# Patient Record
Sex: Male | Born: 1955
Health system: Southern US, Community
[De-identification: ages and names within clinical notes are randomized; demographics above are authoritative.]

## PROBLEM LIST (undated history)

## (undated) DIAGNOSIS — I639 Cerebral infarction, unspecified: Secondary | ICD-10-CM

## (undated) DIAGNOSIS — I219 Acute myocardial infarction, unspecified: Secondary | ICD-10-CM

## (undated) DIAGNOSIS — I10 Essential (primary) hypertension: Secondary | ICD-10-CM

## (undated) DIAGNOSIS — E119 Type 2 diabetes mellitus without complications: Secondary | ICD-10-CM

## (undated) DIAGNOSIS — F209 Schizophrenia, unspecified: Secondary | ICD-10-CM

---

## 2019-09-28 ENCOUNTER — Ambulatory Visit (HOSPITAL_COMMUNITY)
Admission: RE | Admit: 2019-09-28 | Discharge: 2019-09-28 | Disposition: A | Discharge status: Home / Self Care | Payer: TRICARE For Life (TFL) | Attending: Psychiatry | Admitting: Psychiatry

## 2019-09-28 NOTE — BH Assessment (Signed)
Assessment Note  William Fitzpatrick is an 63 y.o. male. Pt denies SI/HI. Pt is experiencing paranoia. Per Pt he was D/C from [facility] yesterday. The Pt was hospitalized for 10 days for paranoia. The Pt has been diagnosed with Schizophrenia. Per Pt he experiences paranoia daily and it has been affecting him most of his life. Per Pt his physician advised him to come to  [facility] after reporting paranoia to him. The Pt states he feels he will go to jail school because of past crimes. The Pt did not want to disclose the crimes. The Pt denied being paranoid that he would harm others or that others would harm him. Pt reports a support system.  Alcario Drought, NP recommends D/C and follow-up at the [place].  Diagnosis:  F20.9 Schizophrenia  Past Medical History: No past medical history on file.   Family History: No family history on file.  Social History:  has no history on file for tobacco, alcohol, and drug.  Additional Social History:  Alcohol / Drug Use Pain Medications: please see mar Prescriptions: please see mar Over the Counter: please see mar History of alcohol / drug use?: No history of alcohol / drug abuse  CIWA:   COWS:    Allergies: Not on File  Home Medications: (Not in a hospital admission)   OB/GYN Status:  No LMP for male patient.  General Assessment Data Location of Assessment: [facility] Assessment [facility] TTS Assessment: In system Is this a Tele or Face-to-Face Assessment?: Face-to-Face Is this an Initial Assessment or a Re-assessment for this encounter?: Initial Assessment Patient Accompanied by:: N/A Language Other than English: No Living Arrangements: Other (Comment) What gender do you identify as?: Male Marital status: Separated Maiden name: NA Pregnancy Status: No Living Arrangements: Alone Can pt return to current living arrangement?: No Admission Status: Voluntary Is patient capable of signing voluntary admission?: Yes Referral Source: Self/Family/Friend Insurance type:  T[employer]are  Medical Screening Exam [facility] Walk-in ONLY) Medical Exam completed: Yes  Crisis Care Plan Living Arrangements: Alone Legal Guardian: Other:(self) Name of Psychiatrist: NA Name of Therapist: NA  Education Status Is patient currently in school?: No Is the patient employed, unemployed or receiving disability?: Receiving disability income  Risk to self with the past 6 months Suicidal Ideation: No Has patient been a risk to self within the past 6 months prior to admission? : No Suicidal Intent: No Has patient had any suicidal intent within the past 6 months prior to admission? : No Is patient at risk for suicide?: No Suicidal Plan?: No Has patient had any suicidal plan within the past 6 months prior to admission? : No Access to Means: No What has been your use of drugs/alcohol within the last 12 months?: NA Previous Attempts/Gestures: No How many times?: 0 Other Self Harm Risks: NA Triggers for Past Attempts: None known Intentional Self Injurious Behavior: None Family Suicide History: No Recent stressful life event(s): Other (Comment) Persecutory voices/beliefs?: No Depression: No Depression Symptoms: (pt denies) Substance abuse history and/or treatment for substance abuse?: No Suicide prevention information given to non-admitted patients: Not applicable  Risk to Others within the past 6 months Homicidal Ideation: No Does patient have any lifetime risk of violence toward others beyond the six months prior to admission? : No Thoughts of Harm to Others: No Current Homicidal Intent: No Current Homicidal Plan: No Access to Homicidal Means: No Identified Victim: NA History of harm to others?: No Assessment of Violence: None Noted Violent Behavior Description: NA Does patient have access to  weapons?: No Criminal Charges Pending?: No Does patient have a court date: No Is patient on probation?: No  Psychosis Hallucinations: None noted Delusions: Unspecified  Mental  Status Report Appearance/Hygiene: Unremarkable Eye Contact: Fair Motor Activity: Freedom of movement Speech: Logical/coherent Level of Consciousness: Alert Mood: Euthymic Affect: Appropriate to circumstance Anxiety Level: Minimal Thought Processes: Coherent Judgement: Impaired Orientation: Person, Place, Time, Situation Obsessive Compulsive Thoughts/Behaviors: None  Cognitive Functioning Concentration: Normal Memory: Recent Intact, Remote Intact Is patient IDD: No Insight: Fair Impulse Control: Fair Appetite: Fair Have you had any weight changes? : No Change Sleep: No Change Total Hours of Sleep: 8 Vegetative Symptoms: None  ADLScreening [facility] Assessment Services) Patient's cognitive ability adequate to safely complete daily activities?: Yes Patient able to express need for assistance with ADLs?: Yes Independently performs ADLs?: Yes (appropriate for developmental age)  Prior Inpatient Therapy Prior Inpatient Therapy: Yes Prior Therapy Dates: 10/13 [facility] Reason for Treatment: Schizophrenia  Prior Outpatient Therapy Prior Outpatient Therapy: Yes Prior Therapy Dates: current [facility] Reason for Treatment: Schizophreniak Does patient have an ACCT team?: No Does patient have Intensive In-House Services?  : No Does patient have [facility] services? : No Does patient have P4CC services?: No  ADL Screening (condition at time of admission) Patient's cognitive ability adequate to safely complete daily activities?: Yes Is the patient deaf or have difficulty hearing?: No Does the patient have difficulty seeing, even when wearing glasses/contacts?: No Does the patient have difficulty concentrating, remembering, or making decisions?: No Patient able to express need for assistance with ADLs?: Yes Does the patient have difficulty dressing or bathing?: No Independently performs ADLs?: Yes (appropriate for developmental  age)       Abuse/Neglect Assessment (Assessment to be complete while patient is alone) Abuse/Neglect Assessment Can Be Completed: Yes Physical Abuse: Denies Verbal Abuse: Denies Sexual Abuse: Denies Exploitation of patient/patient's resources: Denies     Regulatory affairs officer (For Healthcare) Does Patient Have a Medical Advance Directive?: No Would patient like information on creating a medical advance directive?: No - Patient declined          Disposition:  Disposition Initial Assessment Completed for this Encounter: Yes Disposition of Patient: Discharge  On Site Evaluation by:   Reviewed with Physician:    Cyndia Bent 09/28/2019 6:03 PM

## 2019-09-28 NOTE — H&P (Addendum)
Behavioral Health Medical Screening Exam  William Fitzpatrick is an 63 y.o. male.  Presents to behavioral health as a walk-in reporting chronic paranoid ideations.  He reports he was recently discharged from inpatient admission in [place] at the [facility] on yesterday.  He reports he was advised to follow-up here ([place])  in order to be transported to the [place] in [place].  Patient reports spending 10 days in inpatient with follow-up with a follow-up appointment scheduled via tele-health.  Patient reports his support system is located in [place] and was reported he continues to speak to his wife daily.  He denies suicidal or homicidal ideations.  Denies auditory or visual hallucinations.  Reported medication adjustments well and patient.  Reported diagnosis of schizophrenia, depression and paranoid ideations.  Patient encouraged to keep follow-up appointments as recommended by [facility].  Support, encouragement and reassurance was provided.  Total Time spent with patient: 15 minutes     Psychiatric Specialty Exam: Physical Exam  Vitals reviewed. Constitutional: He appears well-developed.  Psychiatric: He has a normal mood and affect. His behavior is normal.    Review of Systems  Psychiatric/Behavioral: Positive for depression. Negative for substance abuse and suicidal ideas. The patient is nervous/anxious.   All other systems reviewed and are negative.   There were no vitals taken for this visit.There is no height or weight on file to calculate BMI.  General Appearance: Casual  Eye Contact:  Fair  Speech:  Clear and Coherent  Volume:  Normal  Mood:  Anxious and Depressed  Affect:  Congruent  Thought Process:  Coherent  Orientation:  Full (Time, Place, and Person)  Thought Content:  Logical and Paranoid Ideation  Suicidal Thoughts:  No  Homicidal Thoughts:  No  Memory:  Immediate;   Fair Recent;   Fair  Judgement:  Fair  Insight:  Fair  Psychomotor Activity:  Normal   Concentration: Concentration: Fair  Recall:  [employer] of Knowledge:Fair  Language: Fair  Akathisia:  No  Handed:  Right  AIMS (if indicated):     Assets:  Communication Skills Desire for Improvement Resilience Social Support  Sleep:       Musculoskeletal: Strength & Muscle Tone: within normal limits Gait & Station: normal Patient leans: N/A  There were no vitals taken for this visit.  Recommendations: Keep follow-[facility] appointment [facility] Based on my evaluation the patient does not appear to have an emergency medical condition.  Derrill Center, NP 09/28/2019, 3:24 PM

## 2019-10-01 ENCOUNTER — Encounter (HOSPITAL_COMMUNITY): Payer: Self-pay | Admitting: Emergency Medicine

## 2019-10-01 ENCOUNTER — Emergency Department (HOSPITAL_COMMUNITY)
Admission: EM | Admit: 2019-10-01 | Discharge: 2019-10-02 | Disposition: A | Discharge status: Home / Self Care | Payer: Medicare Other | Attending: Emergency Medicine | Admitting: Emergency Medicine

## 2019-10-01 ENCOUNTER — Other Ambulatory Visit: Payer: Self-pay

## 2019-10-01 DIAGNOSIS — F32A Depression, unspecified: Secondary | ICD-10-CM

## 2019-10-01 DIAGNOSIS — R45851 Suicidal ideations: Secondary | ICD-10-CM

## 2019-10-01 DIAGNOSIS — F329 Major depressive disorder, single episode, unspecified: Secondary | ICD-10-CM

## 2019-10-01 DIAGNOSIS — F203 Undifferentiated schizophrenia: Secondary | ICD-10-CM | POA: Insufficient documentation

## 2019-10-01 DIAGNOSIS — Z915 Personal history of self-harm: Secondary | ICD-10-CM

## 2019-10-01 DIAGNOSIS — Z794 Long term (current) use of insulin: Secondary | ICD-10-CM | POA: Insufficient documentation

## 2019-10-01 DIAGNOSIS — Z79899 Other long term (current) drug therapy: Secondary | ICD-10-CM | POA: Diagnosis not present

## 2019-10-01 DIAGNOSIS — F209 Schizophrenia, unspecified: Secondary | ICD-10-CM

## 2019-10-01 DIAGNOSIS — Z7982 Long term (current) use of aspirin: Secondary | ICD-10-CM | POA: Diagnosis not present

## 2019-10-01 DIAGNOSIS — F17210 Nicotine dependence, cigarettes, uncomplicated: Secondary | ICD-10-CM | POA: Diagnosis not present

## 2019-10-01 DIAGNOSIS — F200 Paranoid schizophrenia: Secondary | ICD-10-CM

## 2019-10-01 HISTORY — DX: Schizophrenia, unspecified: F20.9

## 2019-10-01 LAB — COMPREHENSIVE METABOLIC PANEL
ALT: 28 U/L (ref 0–44)
AST: 25 U/L (ref 15–41)
Albumin: 4.3 g/dL (ref 3.5–5.0)
Alkaline Phosphatase: 102 U/L (ref 38–126)
Anion gap: 12 (ref 5–15)
BUN: 11 mg/dL (ref 8–23)
CO2: 24 mmol/L (ref 22–32)
Calcium: 9.5 mg/dL (ref 8.9–10.3)
Chloride: 95 mmol/L — ABNORMAL LOW (ref 98–111)
Creatinine, Ser: 0.93 mg/dL (ref 0.61–1.24)
GFR calc Af Amer: >60 mL/min (ref >60)
GFR calc non Af Amer: >60 mL/min (ref >60)
Glucose, Bld: 204 mg/dL — ABNORMAL HIGH (ref 70–99)
Potassium: 3.3 mmol/L — ABNORMAL LOW (ref 3.5–5.1)
Sodium: 131 mmol/L — ABNORMAL LOW (ref 135–145)
Total Bilirubin: 0.7 mg/dL (ref 0.3–1.2)
Total Protein: 7.5 g/dL (ref 6.5–8.1)

## 2019-10-01 LAB — CBG MONITORING, ED
Glucose-Capillary: 261 mg/dL — ABNORMAL HIGH (ref 70–99)
Glucose-Capillary: 131 mg/dL — ABNORMAL HIGH (ref 70–99)
Glucose-Capillary: 168 mg/dL — ABNORMAL HIGH (ref 70–99)

## 2019-10-01 LAB — ETHANOL: Alcohol, Ethyl (B): <10 mg/dL (ref <10)

## 2019-10-01 LAB — RAPID URINE DRUG SCREEN, HOSP PERFORMED
Amphetamines: NOT DETECTED
Barbiturates: NOT DETECTED
Benzodiazepines: NOT DETECTED
Cocaine: NOT DETECTED
Opiates: NOT DETECTED
Tetrahydrocannabinol: NOT DETECTED

## 2019-10-01 LAB — CBC
HCT: 43.9 % (ref 39.0–52.0)
Hemoglobin: 15.5 g/dL (ref 13.0–17.0)
MCH: 32 pg (ref 26.0–34.0)
MCHC: 35.3 g/dL (ref 30.0–36.0)
MCV: 90.7 fL (ref 80.0–100.0)
Platelets: 287 10*3/uL (ref 150–400)
RBC: 4.84 MIL/uL (ref 4.22–5.81)
RDW: 11.4 % — ABNORMAL LOW (ref 11.5–15.5)
WBC: 11.2 10*3/uL — ABNORMAL HIGH (ref 4.0–10.5)
nRBC: 0 % (ref 0.0–0.2)

## 2019-10-01 LAB — ACETAMINOPHEN LEVEL: Acetaminophen (Tylenol), Serum: <10 ug/mL — ABNORMAL LOW (ref 10–30)

## 2019-10-01 LAB — SALICYLATE LEVEL: Salicylate Lvl: <7 mg/dL (ref 2.8–30.0)

## 2019-10-01 MED ORDER — POTASSIUM CHLORIDE CRYS ER 20 MEQ PO TBCR
40.0000 meq | EXTENDED_RELEASE_TABLET | Freq: Once | ORAL | Status: AC
  Administered 2019-10-01: 40 meq via ORAL
  Filled 2019-10-01: qty 2

## 2019-10-01 MED ORDER — INSULIN ASPART 100 UNIT/ML ~~LOC~~ SOLN
5.0000 [IU] | Freq: Three times a day (TID) | SUBCUTANEOUS | Status: DC
  Administered 2019-10-01 – 2019-10-02 (×5): 5 [IU] via SUBCUTANEOUS
  Filled 2019-10-01: qty 0.05

## 2019-10-01 MED ORDER — AMLODIPINE BESYLATE 5 MG PO TABS
10.0000 mg | ORAL_TABLET | Freq: Every day | ORAL | Status: DC
  Administered 2019-10-01 – 2019-10-02 (×2): 10 mg via ORAL
  Filled 2019-10-01 (×2): qty 2

## 2019-10-01 MED ORDER — APOAEQUORIN 20 MG PO CAPS: 20.0000 mg | ORAL_CAPSULE | Freq: Every day | ORAL | Status: DC

## 2019-10-01 MED ORDER — TRAZODONE HCL 50 MG PO TABS
75.0000 mg | ORAL_TABLET | Freq: Every day | ORAL | Status: DC
  Administered 2019-10-01: 75 mg via ORAL
  Filled 2019-10-01: qty 2

## 2019-10-01 MED ORDER — LEVOTHYROXINE SODIUM 75 MCG PO TABS
75.0000 ug | ORAL_TABLET | Freq: Every day | ORAL | Status: DC
  Administered 2019-10-01 – 2019-10-02 (×2): 75 ug via ORAL
  Filled 2019-10-01 (×2): qty 1

## 2019-10-01 MED ORDER — ASPIRIN EC 81 MG PO TBEC
81.0000 mg | DELAYED_RELEASE_TABLET | Freq: Every day | ORAL | Status: DC
  Administered 2019-10-01 – 2019-10-02 (×2): 81 mg via ORAL
  Filled 2019-10-01 (×2): qty 1

## 2019-10-01 MED ORDER — TRAZODONE HCL 50 MG PO TABS: 75.0000 mg | ORAL_TABLET | Freq: Every day | ORAL | Status: DC

## 2019-10-01 MED ORDER — INSULIN GLARGINE 100 UNIT/ML ~~LOC~~ SOLN
20.0000 [IU] | Freq: Every day | SUBCUTANEOUS | Status: DC
  Administered 2019-10-01 – 2019-10-02 (×2): 20 [IU] via SUBCUTANEOUS
  Filled 2019-10-01 (×2): qty 0.2

## 2019-10-01 MED ORDER — INSULIN GLARGINE 100 UNIT/ML ~~LOC~~ SOLN
20.0000 [IU] | Freq: Every day | SUBCUTANEOUS | Status: DC
  Administered 2019-10-01: 20 [IU] via SUBCUTANEOUS
  Filled 2019-10-01: qty 0.2

## 2019-10-01 MED ORDER — HYDROCHLOROTHIAZIDE 12.5 MG PO CAPS
12.5000 mg | ORAL_CAPSULE | Freq: Every day | ORAL | Status: DC
  Administered 2019-10-01 – 2019-10-02 (×2): 12.5 mg via ORAL
  Filled 2019-10-01 (×3): qty 1

## 2019-10-01 MED ORDER — OLANZAPINE 10 MG PO TABS
10.0000 mg | ORAL_TABLET | Freq: Every day | ORAL | Status: DC
  Administered 2019-10-01: 10 mg via ORAL
  Filled 2019-10-01: qty 1

## 2019-10-01 MED ORDER — LOSARTAN POTASSIUM 50 MG PO TABS
100.0000 mg | ORAL_TABLET | Freq: Every day | ORAL | Status: DC
  Administered 2019-10-01 – 2019-10-02 (×2): 100 mg via ORAL
  Filled 2019-10-01 (×3): qty 2

## 2019-10-01 NOTE — ED Triage Notes (Signed)
Patient is stating he is having thoughts of suicide. Patient states that it started about two hour after he called the hotline. Patient is a English as a second language teacher.

## 2019-10-01 NOTE — ED Provider Notes (Signed)
[place] DEPT Provider Note   CSN: [id number] Arrival date & time: 10/01/19  0111     History   Chief Complaint Chief Complaint  Patient presents with   Suicidal    HPI William Fitzpatrick is a 63 y.o. male.     Patient with history of depression presents with suicidal ideation that started earlier this evening. He denies any self harm prior to arrival. No HI/AVH. He reports previous attempt remotely (1990's). He denies any physical complaints.   The history is provided by the patient. No language interpreter was used.    No past medical history on file.  There are no active problems to display for this patient.    Home Medications    Prior to Admission medications   Medication Sig Start Date End Date Taking? Authorizing Provider  amLODipine (NORVASC) 10 MG tablet Take 10 mg by mouth daily.   Yes [provider]  Apoaequorin (PREVAGEN EXTRA STRENGTH) 20 MG CAPS Take 20 mg by mouth daily.   Yes [provider]  aspirin EC 81 MG tablet Take 81 mg by mouth daily.   Yes [provider]  hydrochlorothiazide (MICROZIDE) 12.5 MG capsule Take 12.5 mg by mouth daily.   Yes [provider]  insulin aspart (NOVOLOG) 100 UNIT/ML injection Inject 5 Units into the skin 3 (three) times daily before meals.   Yes [provider]  insulin glargine (LANTUS) 100 UNIT/ML injection Inject 20 Units into the skin daily.   Yes [provider]  levothyroxine (SYNTHROID) 75 MCG tablet Take 75 mcg by mouth daily before breakfast.   Yes [provider]  losartan (COZAAR) 100 MG tablet Take 100 mg by mouth daily.   Yes [provider]  OLANZapine (ZYPREXA) 20 MG tablet Take 10 mg by mouth at bedtime.   Yes [provider]  traZODone (DESYREL) 150 MG tablet Take 75 mg by mouth at bedtime.   Yes [provider]  Vitamin D, Ergocalciferol, (DRISDOL) 1.25 MG (50000 UT) CAPS capsule Take  50,000 Units by mouth every Tuesday.   Yes [provider]    Family History No family history on file.  Social History Social History   Tobacco Use   Smoking status: Not on file  Substance Use Topics   Alcohol use: Not Currently   Drug use: Not Currently     Allergies   Patient has no known allergies.   Review of Systems Review of Systems  Constitutional: Negative for chills and fever.  HENT: Negative.   Respiratory: Negative.   Cardiovascular: Negative.   Gastrointestinal: Negative.   Musculoskeletal: Negative.   Skin: Negative.   Neurological: Negative.   Psychiatric/Behavioral: Positive for dysphoric mood and suicidal ideas.     Physical Exam Updated Vital Signs BP (!) 160/82 (BP Location: Right Arm)    Pulse 74    Temp 98.1 F (36.7 C) (Oral)    Resp 18    Ht 5\' 8"  (1.727 m)    Wt 86.2 kg    SpO2 99%    BMI 28.89 kg/m   Physical Exam Constitutional:      Appearance: He is well-developed.  HENT:     Head: Normocephalic.  Neck:     Musculoskeletal: Normal range of motion and neck supple.  Cardiovascular:     Rate and Rhythm: Normal rate and regular rhythm.  Pulmonary:     Effort: Pulmonary effort is normal.     Breath sounds: Normal breath sounds.  Abdominal:     General: Bowel sounds are normal.     Palpations: Abdomen is soft.     Tenderness: There is no abdominal tenderness. There is no guarding or rebound.  Musculoskeletal: Normal range of motion.  Skin:    General: Skin is warm and dry.     Findings: No rash.  Neurological:     Mental Status: He is alert and oriented to person, place, and time.  Psychiatric:        Attention and Perception: He does not perceive auditory or visual hallucinations.        Mood and Affect: Mood is depressed.        Speech: Speech normal.        Behavior: Behavior normal. Behavior is cooperative.        Thought Content: Thought content includes suicidal ideation. Thought content does not include  homicidal ideation.      ED Treatments / Results  Labs (all labs ordered are listed, but only abnormal results are displayed) Labs Reviewed  COMPREHENSIVE METABOLIC PANEL  ETHANOL  SALICYLATE LEVEL  ACETAMINOPHEN LEVEL  CBC  RAPID URINE DRUG SCREEN, HOSP PERFORMED    EKG None  Radiology No results found.  Procedures Procedures (including critical care time)  Medications Ordered in ED Medications - No data to display   Initial Impression / Assessment and Plan / ED Course  I have reviewed the triage vital signs and the nursing notes.  Pertinent labs & imaging results that were available during my care of the patient were reviewed by me and considered in my medical decision making (see chart for details).        Patient to ED with SI, remote h/o attempt. He is oriented, cooperative. Depressed affect.   Labs reviewed. He is considered medically cleared. TTS evaluation completed. He will be kept in the ED tonight and re-evaluated by psychiatry in the am.   During ED encounter, the patient endorses that his SI has gone away. He states that it was related to feeling that he had killed someone and has found out that is not the case. He further states he has been under investigation for the past 40 years and is concerned that he is facing life in prison or the death penalty. New statements were discussed with TTS, T[place]lese, who reports she is aware and recommendation was still to have psych assess in am.   Feel the patient exhibits depressive symptoms, has admitted SI and has a history of same. Further, the patient is making statements that may represent acute delusion or psychosis. If he attempts to discharge home, feel IVC would be reasonable.    Final Clinical Impressions(s) / ED Diagnoses   Final diagnoses:  None   1. Depression 2. Suicidal ideation  ED Discharge Orders    None       Elpidio Anis, Cordelia Poche 10/01/19 0720    Mesner, Barbara Cower, MD 10/01/19 2257

## 2019-10-01 NOTE — BHH Counselor (Signed)
Clinician to spoke [place], [place] and the pt denies he's and wants something to clam him down. Clinician noted Shari's concerns based on the pt's active delusions. Clinician expressed during the assessment the pt shared he feels he killed a couple of people when he was working on a house because he moved the pool pump a few feet, no crime has been filed or reported, he just feels like he did it.     Vertell Novak, MS, [facility], [facility] Triage Specialist [phone].

## 2019-10-01 NOTE — Progress Notes (Signed)
Received Nikalas from the main ED at 0430 hrs this AM. A sitter is at the bedside. He immediately went to sleep and was awaken for his VS check and he received his synthroid. He continued to sleep throughout the morning.

## 2019-10-01 NOTE — ED Notes (Signed)
Error insulin lantus not given 20 units not given

## 2019-10-01 NOTE — ED Notes (Addendum)
Pt changed into paper scrubs, wanded by security, and belongings (one white bag) placed in the cabinet labeled, "patient belongings [address]."
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
## 2019-10-01 NOTE — [facility] Counselor (Signed)
TTS consult to complete assessment however there is no documentation indicting the reason for TTS consult. Clinician to check back.    Vertell Novak, [place], [facility], [facility] Triage Specialist [phone]

## 2019-10-01 NOTE — ED Notes (Signed)
Pt to room 42. Pt oriented to unit. Pt flat, dull, blunted. Pt upset with thoughts. Pt stated, "wants to get help with thoughts."  Pt denied SI but having thoughts.  Pt having about the bad things he has done.

## 2019-10-01 NOTE — Progress Notes (Signed)
PHARMACIST - PHYSICIAN ORDER COMMUNICATION  CONCERNING: P&T Medication Policy on Herbal Medications  DESCRIPTION:  This patient's order for:  Prevagen  has been noted.  This product(s) is classified as an "herbal" or natural product. Due to a lack of definitive safety studies or FDA approval, nonstandard manufacturing practices, plus the potential risk of unknown drug-drug interactions while on inpatient medications, the Pharmacy and Therapeutics Committee does not permit the use of "herbal" or natural products of this type within [facility].   ACTION TAKEN: The pharmacy department is unable to verify this order at this time and your patient has been informed of this safety policy. Please reevaluate patient's clinical condition at discharge and address if the herbal or natural product(s) should be resumed at that time.   Thanks Dorrene German 10/01/2019 2:52 AM
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
## 2019-10-02 DIAGNOSIS — F203 Undifferentiated schizophrenia: Secondary | ICD-10-CM | POA: Diagnosis not present

## 2019-10-02 LAB — CBG MONITORING, ED
Glucose-Capillary: 142 mg/dL — ABNORMAL HIGH (ref 70–99)
Glucose-Capillary: 190 mg/dL — ABNORMAL HIGH (ref 70–99)

## 2019-10-02 MED ORDER — POLYETHYLENE GLYCOL 3350 17 G PO PACK: 17.0000 g | PACK | Freq: Every day | ORAL | 0 refills | Status: DC

## 2019-10-02 MED ORDER — POLYETHYLENE GLYCOL 3350 17 G PO PACK
17.0000 g | PACK | Freq: Every day | ORAL | Status: DC
  Administered 2019-10-02: 17 g via ORAL
  Filled 2019-10-02: qty 1

## 2019-10-02 MED ORDER — INSULIN GLARGINE 100 UNIT/ML ~~LOC~~ SOLN: 20.0000 [IU] | Freq: Every day | SUBCUTANEOUS | 11 refills | Status: DC

## 2019-10-02 NOTE — Discharge Instructions (Signed)
Suicidal Feelings: How to Help Yourself Suicide is when you end your own life. There are many things you can do to help yourself feel better when struggling with these feelings. Many services and people are available to support you and others who struggle with similar feelings.  If you ever feel like you may hurt yourself or others, or have thoughts about taking your own life, get help right away. To get help:  Call your local emergency services (911 in the [place]  The [employer]'s health and human services helpline (211 in the [place]  Go to your nearest emergency department.  Call a suicide hotline to speak with a trained counselor. The following suicide hotlines are available in the [place]: ? 1-800-273-TALK (1-800-273-8255). ? 1-800-SUICIDE (1-800-784-2433). ? 1-888-628-9454. This is a hotline for Spanish speakers. ? 1-800-799-4889. This is a hotline for TTY users. ? 1-866-4-U-TREVOR (1-866-488-7386). This is a hotline for lesbian, gay, bisexual, transgender, or questioning youth. ? For a list of hotlines in [place], visit www.suicide.org/hotlines/international/[place]-suicide-hotlines.html  Contact a crisis center or a local suicide prevention center. To find a crisis center or suicide prevention center: ? Call your local hospital, clinic, community service organization, mental health center, social service provider, or health department. Ask for help with connecting to a crisis center. ? For a list of crisis centers in the [place], visit: suicidepreventionlifeline.org ? For a list of crisis centers in [place], visit: suicideprevention.ca How to help yourself feel better   Promise yourself that you will not do anything extreme when you have suicidal feelings. Remember, there is hope. Many people have gotten through suicidal thoughts and feelings, and you can too. If you have had these feelings before, remind yourself that you can get through them again.  Let family, friends,  teachers, or counselors know how you are feeling. Try not to separate yourself from those who care about you and want to help you. Talk with someone every day, even if you do not feel sociable. Face-to-face conversation is best to help them understand your feelings.  Contact a mental health care provider and work with this person regularly.  Make a safety plan that you can follow during a crisis. Include phone numbers of suicide prevention hotlines, mental health professionals, and trusted friends and family members you can call during an emergency. Save these numbers on your phone.  If you are thinking of taking a lot of medicine, give your medicine to someone who can give it to you as prescribed. If you are on antidepressants and are concerned you will overdose, tell your health care provider so that he or she can give you safer medicines.  Try to stick to your routines. Follow a schedule every day. Make self-care a priority.  Make a list of realistic goals, and cross them off when you achieve them. Accomplishments can give you a sense of worth.  Wait until you are feeling better before doing things that you find difficult or unpleasant.  Do things that you have always enjoyed to take your mind off your feelings. Try reading a book, or listening to or playing music. Spending time outside, in nature, may help you feel better. Follow these instructions at home:   Visit your primary health care provider every year for a checkup.  Work with a mental health care provider as needed.  Eat a well-balanced diet, and eat regular meals.  Get plenty of rest.  Exercise if you are able. Just 30 minutes of exercise each day can   help you feel better.  Take over-the-counter and prescription medicines only as told by your health care provider. Ask your mental health care provider about the possible side effects of any medicines you are taking.  Do not use alcohol or drugs, and remove these substances  from your home.  Remove weapons, poisons, knives, and other deadly items from your home. General recommendations  Keep your living space well lit.  When you are feeling well, write yourself a letter with tips and support that you can read when you are not feeling well.  Remember that life's difficulties can be sorted out with help. Conditions can be treated, and you can learn behaviors and ways of thinking that will help you. Where to find more information  National Suicide Prevention Lifeline: www.suicidepreventionlifeline.org  Hopeline: www.hopeline.com  American Foundation for Suicide Prevention: www.afsp.org  The Trevor Project (for lesbian, gay, bisexual, transgender, or questioning youth): www.thetrevorproject.org Contact a health care provider if:  You feel as though you are a burden to others.  You feel agitated, angry, vengeful, or have extreme mood swings.  You have withdrawn from family and friends. Get help right away if:  You are talking about suicide or wishing to die.  You start making plans for how to commit suicide.  You feel that you have no reason to live.  You start making plans for putting your affairs in order, saying goodbye, or giving your possessions away.  You feel guilt, shame, or unbearable pain, and it seems like there is no way out.  You are frequently using drugs or alcohol.  You are engaging in risky behaviors that could lead to death. If you have any of these symptoms, get help right away. Call emergency services, go to your nearest emergency department or crisis center, or call a suicide crisis helpline. Summary  Suicide is when you take your own life.  Promise yourself that you will not do anything extreme when you have suicidal feelings.  Let family, friends, teachers, or counselors know how you are feeling.  Get help right away if you feel as though life is getting too tough to handle and you are thinking about suicide. This  information is not intended to replace advice given to you by your health care provider. Make sure you discuss any questions you have with your health care provider. Document Released: 06/07/2003 Document Revised: 03/24/2019 Document Reviewed: 07/14/2017 Elsevier Patient Education  2020 Elsevier Inc.  

## 2019-10-02 NOTE — Discharge Summary (Addendum)
Patient re-evaluated today by the mental health team.  He greets the mental health team, is calm and cooperates with answering questions.  He appearance today is more casual and he is reasonably groomed.  He is more coherent today and states, " I am great and feeling really good mentally."  His mood is euthymic and his affect correlates this.  He no longer has suicidal ideations.  He denies homicidal ideations and denies audible or visual hallucinations.  He will follow-up with the [facility] for continued outpatient mental health services.    Disposition:  Discharge home.  The patient appears reasonably screened and/or stabilized for discharge and does not appear to have emergency medical/psychiatric concerns/conditions requiring further screening, evaluation, or treatment at this time prior to discharge.    Recommend the following:  1. Take all of you medications as prescribed by your mental healthcare provider.   2. Report any adverse effects and reactions from your medications to your outpatient provider promptly.  3.  Do not engage in alcohol and or illegal drug use while on prescription medicines. 4. Keep all scheduled appointments. This is to ensure that you are getting refills on time and to avoid any interruption in your medication.  If you are unable to keep an appointment call to reschedule.  Be sure to follow up with resources and follow ups given. 5. In the event of worsening symptoms call the crisis hotline, 911, and or go to the nearest emergency department for appropriate evaluation and treatment of symptoms. 6. Follow-up with your primary care provider for your medical issues, concerns and or health care needs.    Patient seen face-to-face for psychiatric evaluation, chart reviewed and case discussed with the physician extender and developed treatment plan. Reviewed the information documented and agree with the treatment plan. Corena Pilgrim, MD

## 2019-10-02 NOTE — ED Notes (Signed)
Pt discharged home. Discharged instructions read to pt who verbalized understanding. All belongings returned to pt. Denies SI/HI, is not delusional and not responding to internal stimuli. Escorted pt to the ED exit.   

## 2020-09-10 ENCOUNTER — Encounter: Payer: Self-pay | Admitting: Podiatry

## 2020-09-10 ENCOUNTER — Other Ambulatory Visit: Payer: Self-pay

## 2020-09-10 ENCOUNTER — Ambulatory Visit (INDEPENDENT_AMBULATORY_CARE_PROVIDER_SITE_OTHER): Payer: Medicare Other | Admitting: Podiatry

## 2020-09-10 VITALS — BP 167/94 | HR 78 | Temp 97.6°F

## 2020-09-10 DIAGNOSIS — B351 Tinea unguium: Secondary | ICD-10-CM | POA: Diagnosis not present

## 2020-09-10 DIAGNOSIS — M79674 Pain in right toe(s): Secondary | ICD-10-CM | POA: Diagnosis not present

## 2020-09-10 DIAGNOSIS — E1149 Type 2 diabetes mellitus with other diabetic neurological complication: Secondary | ICD-10-CM

## 2020-09-10 DIAGNOSIS — M79675 Pain in left toe(s): Secondary | ICD-10-CM | POA: Diagnosis not present

## 2020-09-10 DIAGNOSIS — E1140 Type 2 diabetes mellitus with diabetic neuropathy, unspecified: Secondary | ICD-10-CM | POA: Diagnosis not present

## 2020-09-10 NOTE — Patient Instructions (Signed)
Diabetes Mellitus and Foot Care Foot care is an important part of your health, especially when you have diabetes. Diabetes may cause you to have problems because of poor blood flow (circulation) to your feet and legs, which can cause your skin to:  Become thinner and drier.  Break more easily.  Heal more slowly.  Peel and crack. You may also have nerve damage (neuropathy) in your legs and feet, causing decreased feeling in them. This means that you may not notice minor injuries to your feet that could lead to more serious problems. Noticing and addressing any potential problems early is the best way to prevent future foot problems. How to care for your feet Foot hygiene  Wash your feet daily with warm water and mild soap. Do not use hot water. Then, pat your feet and the areas between your toes until they are completely dry. Do not soak your feet as this can dry your skin.  Trim your toenails straight across. Do not dig under them or around the cuticle. File the edges of your nails with an emery board or nail file.  Apply a moisturizing lotion or petroleum jelly to the skin on your feet and to dry, brittle toenails. Use lotion that does not contain alcohol and is unscented. Do not apply lotion between your toes. Shoes and socks  Wear clean socks or stockings every day. Make sure they are not too tight. Do not wear knee-high stockings since they may decrease blood flow to your legs.  Wear shoes that fit properly and have enough cushioning. Always look in your shoes before you put them on to be sure there are no objects inside.  To break in new shoes, wear them for just a few hours a day. This prevents injuries on your feet. Wounds, scrapes, corns, and calluses  Check your feet daily for blisters, cuts, bruises, sores, and redness. If you cannot see the bottom of your feet, use a mirror or ask someone for help.  Do not cut corns or calluses or try to remove them with medicine.  If you  find a minor scrape, cut, or break in the skin on your feet, keep it and the skin around it clean and dry. You may clean these areas with mild soap and water. Do not clean the area with peroxide, alcohol, or iodine.  If you have a wound, scrape, corn, or callus on your foot, look at it several times a day to make sure it is healing and not infected. Check for: ? Redness, swelling, or pain. ? Fluid or blood. ? Warmth. ? Pus or a bad smell. General instructions  Do not cross your legs. This may decrease blood flow to your feet.  Do not use heating pads or hot water bottles on your feet. They may burn your skin. If you have lost feeling in your feet or legs, you may not know this is happening until it is too late.  Protect your feet from hot and cold by wearing shoes, such as at the beach or on hot pavement.  Schedule a complete foot exam at least once a year (annually) or more often if you have foot problems. If you have foot problems, report any cuts, sores, or bruises to your health care provider immediately. Contact a health care provider if:  You have a medical condition that increases your risk of infection and you have any cuts, sores, or bruises on your feet.  You have an injury that is not   healing.  You have redness on your legs or feet.  You feel burning or tingling in your legs or feet.  You have pain or cramps in your legs and feet.  Your legs or feet are numb.  Your feet always feel cold.  You have pain around a toenail. Get help right away if:  You have a wound, scrape, corn, or callus on your foot and: ? You have pain, swelling, or redness that gets worse. ? You have fluid or blood coming from the wound, scrape, corn, or callus. ? Your wound, scrape, corn, or callus feels warm to the touch. ? You have pus or a bad smell coming from the wound, scrape, corn, or callus. ? You have a fever. ? You have a red line going up your leg. Summary  Check your feet every day  for cuts, sores, red spots, swelling, and blisters.  Moisturize feet and legs daily.  Wear shoes that fit properly and have enough cushioning.  If you have foot problems, report any cuts, sores, or bruises to your health care provider immediately.  Schedule a complete foot exam at least once a year (annually) or more often if you have foot problems. This information is not intended to replace advice given to you by your health care provider. Make sure you discuss any questions you have with your health care provider. Document Revised: 08/24/2019 Document Reviewed: 01/02/2017 Elsevier Patient Education  2020 Elsevier Inc.  

## 2020-09-10 NOTE — Progress Notes (Signed)
Subjective:   Patient ID: William Fitzpatrick, male   DOB: 64 y.o.   MRN: [id number]   HPI Patient presents with a long-term history of nail disease who is not in good control with his diabetes with A1c of 14 who has had nail care provided over the years and cannot take care of them himself.  Patient does smoke approximately half pack per day and is not currently active   Review of Systems  All other systems reviewed and are negative.       Objective:  Physical Exam Vitals and nursing note reviewed.  Constitutional:      Appearance: He is well-developed.  Pulmonary:     Effort: Pulmonary effort is normal.  Musculoskeletal:        General: Normal range of motion.  Skin:    General: Skin is warm.  Neurological:     Mental Status: He is alert.     Vascular status was found to be intact with patient found to have moderate diminishment sharp dull vibratory and is noted to have incurvated nailbeds 1-5 both feet that are thickened deformed and can become painful with very high A1c's and high risk for him to take care of this himself.  Patient did have good digital perfusion well oriented     Assessment:  Mycotic nail infection bilateral that do become painful with patient who has diabetic neuropathy and has high A1c     Plan:  H&P diabetic education given to patient and discussed.  At this point I debrided nailbeds 1-5 both feet no iatrogenic bleeding and patient will return for routine care and will be seen back if any other issues were occurred and at this time again I reviewed with him the importance of daily inspections of his feet

## 2020-10-26 ENCOUNTER — Other Ambulatory Visit: Payer: Self-pay

## 2020-10-26 ENCOUNTER — Encounter: Payer: Self-pay | Admitting: Endocrinology

## 2020-10-26 ENCOUNTER — Ambulatory Visit (INDEPENDENT_AMBULATORY_CARE_PROVIDER_SITE_OTHER): Payer: Medicare Other | Admitting: Endocrinology

## 2020-10-26 VITALS — BP 132/80 | HR 76 | Ht 68.0 in | Wt 188.0 lb

## 2020-10-26 DIAGNOSIS — Z794 Long term (current) use of insulin: Secondary | ICD-10-CM | POA: Diagnosis not present

## 2020-10-26 DIAGNOSIS — E1369 Other specified diabetes mellitus with other specified complication: Secondary | ICD-10-CM

## 2020-10-26 DIAGNOSIS — E11319 Type 2 diabetes mellitus with unspecified diabetic retinopathy without macular edema: Secondary | ICD-10-CM | POA: Diagnosis not present

## 2020-10-26 LAB — POCT GLYCOSYLATED HEMOGLOBIN (HGB A1C): Hemoglobin A1C: 10.2 % — AB (ref 4.0–5.6)

## 2020-10-26 MED ORDER — NOVOLOG MIX 70/30 FLEXPEN (70-30) 100 UNIT/ML ~~LOC~~ SUPN: PEN_INJECTOR | SUBCUTANEOUS | 3 refills | Status: DC

## 2020-10-26 NOTE — Patient Instructions (Signed)
good diet and exercise significantly improve the control of your diabetes.  please let me know if you wish to be referred to a dietician.  high blood sugar is very risky to your health.  you should see an eye doctor and dentist every year.  It is very important to get all recommended vaccinations.  Controlling your blood pressure and cholesterol drastically reduces the damage diabetes does to your body.  Those who smoke should quit.  Please discuss these with your doctor.  check your blood sugar 4 times a day: before the 3 meals, and at bedtime.  also check if you have symptoms of your blood sugar being too high or too low.  please keep a record of the readings and bring it to your next appointment here (or you can bring the meter itself).  You can write it on any piece of paper.  please call us sooner if your blood sugar goes below 70, or if you have a lot of readings over 200. For now, please change the insulin to 35 units units with breakfast, and 15 with supper. Please see a pump trainer.  Please come back for a follow-up appointment in 1 month.

## 2020-10-26 NOTE — Progress Notes (Signed)
Subjective:    Patient ID: William Fitzpatrick, male    DOB: March 16, 1956, 64 y.o.   MRN: [id number]  HPI pt is referred by Hillery Aldo, NP, for diabetes.  Pt states DM was dx'ed in 1994; it is complicated by DR; he has been on insulin since 2010 (he took pump rx 2020, until [place] advised him to d/c--pt says he had good glycemic control on it); pt says his diet and exercise are not good; he has never had pancreatitis, pancreatic surgery, severe hypoglycemia or DKA.  He takes Novolog 70/30, 25 units BID.  He brings a record of his cbg's which I have reviewed today.  cbg varies from 72-334.  It is in general higher as the day goes on.   Past Medical History:  Diagnosis Date  . Schizophrenia (HCC)     No past surgical history on file.  Social History   Socioeconomic History  . Marital status: Legally Separated    Spouse name: Not on file  . Number of children: Not on file  . Years of education: Not on file  . Highest education level: Not on file  Occupational History  . Not on file  Tobacco Use  . Smoking status: Current Every Day Smoker    Types: Cigarettes  . Smokeless tobacco: Never Used  Vaping Use  . Vaping Use: Never used  Substance and Sexual Activity  . Alcohol use: Yes  . Drug use: Not Currently  . Sexual activity: Not on file  Other Topics Concern  . Not on file  Social History Narrative  . Not on file   Social Determinants of Health   Financial Resource Strain:   . Difficulty of Paying Living Expenses: Not on file  Food Insecurity:   . Worried About Programme researcher, broadcasting/film/video in the Last Year: Not on file  . Ran Out of Food in the Last Year: Not on file  Transportation Needs:   . Lack of Transportation (Medical): Not on file  . Lack of Transportation (Non-Medical): Not on file  Physical Activity:   . Days of Exercise per Week: Not on file  . Minutes of Exercise per Session: Not on file  Stress:   . Feeling of Stress : Not on file  Social Connections:   . Frequency of  Communication with Friends and Family: Not on file  . Frequency of Social Gatherings with Friends and Family: Not on file  . Attends Religious Services: Not on file  . Active Member of Clubs or Organizations: Not on file  . Attends Banker Meetings: Not on file  . Marital Status: Not on file  Intimate Partner Violence:   . Fear of Current or Ex-Partner: Not on file  . Emotionally Abused: Not on file  . Physically Abused: Not on file  . Sexually Abused: Not on file    Current Outpatient Medications on File Prior to Visit  Medication Sig Dispense Refill  . amLODipine (NORVASC) 10 MG tablet Take 10 mg by mouth daily.    . hydrochlorothiazide (MICROZIDE) 12.5 MG capsule Take 12.5 mg by mouth daily.    Marland Kitchen levothyroxine (SYNTHROID) 75 MCG tablet Take 75 mcg by mouth daily before breakfast.    . metFORMIN (GLUCOPHAGE-XR) 500 MG 24 hr tablet Take 1,000 mg by mouth 2 (two) times daily.    Marland Kitchen aspirin EC 81 MG tablet Take 81 mg by mouth daily. (Patient not taking: Reported on 10/26/2020)    . losartan (COZAAR) 100 MG tablet  Take 100 mg by mouth daily. (Patient not taking: Reported on 10/26/2020)    . OLANZapine (ZYPREXA) 20 MG tablet Take 10 mg by mouth at bedtime. (Patient not taking: Reported on 10/26/2020)    . polyethylene glycol (MIRALAX / GLYCOLAX) 17 g packet Take 17 g by mouth daily. (Patient not taking: Reported on 10/26/2020) 14 each 0  . traZODone (DESYREL) 150 MG tablet Take 75 mg by mouth at bedtime. (Patient not taking: Reported on 10/26/2020)    . Vitamin D, Ergocalciferol, (DRISDOL) 1.25 MG (50000 UT) CAPS capsule Take 50,000 Units by mouth every Tuesday. (Patient not taking: Reported on 10/26/2020)     No current facility-administered medications on file prior to visit.    No Known Allergies  Family History  Problem Relation Age of Onset  . Diabetes Neg Hx     BP 132/80   Pulse 76   Ht 5\' 8"  (1.727 m)   Wt 188 lb (85.3 kg)   SpO2 97%   BMI 28.59 kg/m     Review of Systems denies n/v, urinary frequency, and depression.  He has lost 20 lbs x 1 year--unintentional.       Objective:   Physical Exam VITAL SIGNS:  See vs page GENERAL: no distress Pulses: dorsalis pedis intact bilat.   MSK: no deformity of the feet CV: no leg edema Skin:  no ulcer on the feet.  normal color and temp on the feet. Neuro: sensation is intact to touch on the feet.   Lab Results  Component Value Date   HGBA1C 10.2 (A) 10/26/2020    I have reviewed outside records, and summarized: Pt was noted to have elevated A1c, and referred here.  HTN, smoking, obesity, and dyslipidemia were also addressed.      Assessment & Plan:  Insulin-requiring type 2 DM, with DR: uncontrolled.    Patient Instructions  good diet and exercise significantly improve the control of your diabetes.  please let me know if you wish to be referred to a dietician.  high blood sugar is very risky to your health.  you should see an eye doctor and dentist every year.  It is very important to get all recommended vaccinations.  Controlling your blood pressure and cholesterol drastically reduces the damage diabetes does to your body.  Those who smoke should quit.  Please discuss these with your doctor.  check your blood sugar 4 times a day: before the 3 meals, and at bedtime.  also check if you have symptoms of your blood sugar being too high or too low.  please keep a record of the readings and bring it to your next appointment here (or you can bring the meter itself).  You can write it on any piece of paper.  please call 13/11/2020 sooner if your blood sugar goes below 70, or if you have a lot of readings over 200. For now, please change the insulin to 35 units units with breakfast, and 15 with supper. Please see a pump trainer.  Please come back for a follow-up appointment in 1 month.    When pt starts pump, he should start with these settings: basal rate of 0.8 units/hr. bolus of 1 unit/15 grams  carbohydrate correction bolus (which some people call "sensitivity," or "insulin sensitivity ratio," or just "isr") of 1 unit for each 50 by which your glucose exceeds 100

## 2020-10-27 DIAGNOSIS — E1165 Type 2 diabetes mellitus with hyperglycemia: Secondary | ICD-10-CM | POA: Insufficient documentation

## 2020-10-27 DIAGNOSIS — E119 Type 2 diabetes mellitus without complications: Secondary | ICD-10-CM | POA: Insufficient documentation

## 2020-11-07 ENCOUNTER — Ambulatory Visit: Payer: Medicare Other | Admitting: Nutrition

## 2020-11-12 ENCOUNTER — Telehealth: Payer: Self-pay | Admitting: Nutrition

## 2020-11-12 ENCOUNTER — Encounter: Payer: Medicare Other | Attending: Endocrinology | Admitting: Nutrition

## 2020-11-12 ENCOUNTER — Other Ambulatory Visit: Payer: Self-pay

## 2020-11-12 DIAGNOSIS — E1165 Type 2 diabetes mellitus with hyperglycemia: Secondary | ICD-10-CM | POA: Insufficient documentation

## 2020-11-12 MED ORDER — DEXCOM G6 TRANSMITTER MISC: 1.0000 | Freq: Once | 1 refills | Status: AC

## 2020-11-12 MED ORDER — DEXCOM G6 SENSOR MISC: 1.0000 | 3 refills | Status: DC

## 2020-11-12 NOTE — Telephone Encounter (Signed)
done

## 2020-11-12 NOTE — Progress Notes (Signed)
Patient is here to restart his Medtronic 530G insulin pump.  Settings were put in per Dr. George Hugh orders on 10/26/20:  Basal rate: 0.8, I/C: 15, ISF: 50, and target: 100.  He did not take his 70/30 insulin last night, so pump was started at 8:30AM.  We reviewed how to give a bolus, and how to change a cartridge.  He filled a cartridge with Novolog insulin and attached a infusion set to his right abdomen without difficulty.   He is wanting to start the [employer] pump, and requested a Dexcom CGM.  Paperwork was filled out for the [employer] pump and faxed to [employer] at his request.  His pump is not out of warrenty until Feb. 19th of 2022.   He reports having been on a Dexcom before, and we went in to link his pump to his Clarity app, but he could not remember his user name and password.  He had another appointment in 30 min., so was not able to do this at this time.  He will try to connect to the app and put in the link code for [place], and if he has questions will call me back  He had no final questions.  He was told to make an appointment with Dr. Everardo All this Friday, and he reported good understanding of this.  He had no final questions.

## 2020-11-12 NOTE — Patient Instructions (Signed)
Call Dexcom  For help with linking to our practice.   Call if blood sugars drop below 70, or remain over 250.

## 2020-11-12 NOTE — Telephone Encounter (Signed)
Please order him the Dexcom G6 sensors (1 Q 12 hours) and transmitter (1 Q 3 months) and they would go to [employer].   Thank you

## 2020-11-13 ENCOUNTER — Encounter: Payer: Medicare Other | Admitting: Nutrition

## 2020-11-13 ENCOUNTER — Telehealth: Payer: Self-pay | Admitting: Nutrition

## 2020-11-13 NOTE — Telephone Encounter (Signed)
Patient reported no difficulty giving boluses, and saying that he is putting in blood sugar readings as well as carbs.  He reports having 1 low blood sugar this AM of 68.  He was told that if he has another low before Friday to give the office a call.  He agreed to do this, and had no final questions

## 2020-11-14 ENCOUNTER — Ambulatory Visit: Payer: Medicare Other | Admitting: Nutrition

## 2020-11-14 ENCOUNTER — Telehealth: Payer: Self-pay | Admitting: Nutrition

## 2020-11-14 NOTE — Telephone Encounter (Signed)
Ok, please reduce basal to 0.6 units/HR

## 2020-11-14 NOTE — Telephone Encounter (Signed)
Last night at [place] blood sugar dropped to 90, and ate a sandwich which he did not bolus for.  His blood sugar was 101 this Am.  Yesterday FBS was 65 at 6AM.   I am leaving now.  Please refer note to CMA.  Thank you.

## 2020-11-14 NOTE — Telephone Encounter (Signed)
Pt got another low reading this morning which was 90, Transferred to Linda's line

## 2020-11-15 NOTE — Telephone Encounter (Signed)
See new telephone encounter on 12/1 sent to Dr. Everardo All'

## 2020-11-15 NOTE — Telephone Encounter (Signed)
Called patient, LVM advising of message from MD.  Advised patient to call back in a few days to let us know how his sugars are doing.  Left call back number.

## 2020-11-16 ENCOUNTER — Ambulatory Visit: Payer: Medicare Other | Admitting: Endocrinology

## 2020-11-23 ENCOUNTER — Telehealth: Payer: Self-pay | Admitting: Dietician

## 2020-11-26 ENCOUNTER — Other Ambulatory Visit: Payer: Self-pay

## 2020-11-26 ENCOUNTER — Encounter: Payer: Self-pay | Admitting: Podiatry

## 2020-11-26 ENCOUNTER — Ambulatory Visit (INDEPENDENT_AMBULATORY_CARE_PROVIDER_SITE_OTHER): Payer: Medicare Other | Admitting: Podiatry

## 2020-11-26 DIAGNOSIS — M79674 Pain in right toe(s): Secondary | ICD-10-CM | POA: Diagnosis not present

## 2020-11-26 DIAGNOSIS — M79675 Pain in left toe(s): Secondary | ICD-10-CM | POA: Diagnosis not present

## 2020-11-26 DIAGNOSIS — E1149 Type 2 diabetes mellitus with other diabetic neurological complication: Secondary | ICD-10-CM

## 2020-11-26 DIAGNOSIS — B351 Tinea unguium: Secondary | ICD-10-CM | POA: Diagnosis not present

## 2020-11-26 DIAGNOSIS — E1140 Type 2 diabetes mellitus with diabetic neuropathy, unspecified: Secondary | ICD-10-CM

## 2020-11-27 ENCOUNTER — Encounter: Payer: Self-pay | Admitting: Endocrinology

## 2020-11-27 ENCOUNTER — Ambulatory Visit (INDEPENDENT_AMBULATORY_CARE_PROVIDER_SITE_OTHER): Payer: Medicare Other | Admitting: Endocrinology

## 2020-11-27 VITALS — BP 128/80 | HR 88 | Ht 68.0 in | Wt 187.0 lb

## 2020-11-27 DIAGNOSIS — Z794 Long term (current) use of insulin: Secondary | ICD-10-CM | POA: Diagnosis not present

## 2020-11-27 DIAGNOSIS — E11319 Type 2 diabetes mellitus with unspecified diabetic retinopathy without macular edema: Secondary | ICD-10-CM | POA: Diagnosis not present

## 2020-11-27 LAB — BASIC METABOLIC PANEL
BUN: 12 mg/dL (ref 6–23)
CO2: 25 mEq/L (ref 19–32)
Calcium: 9.4 mg/dL (ref 8.4–10.5)
Chloride: 98 mEq/L (ref 96–112)
Creatinine, Ser: 0.77 mg/dL (ref 0.40–1.50)
GFR: 93.01 mL/min (ref >60.00)
Glucose, Bld: 197 mg/dL — ABNORMAL HIGH (ref 70–99)
Potassium: 3.6 mEq/L (ref 3.5–5.1)
Sodium: 133 mEq/L — ABNORMAL LOW (ref 135–145)

## 2020-11-27 LAB — T4, FREE: Free T4: 0.81 ng/dL (ref 0.60–1.60)

## 2020-11-27 LAB — TSH: TSH: 2.39 u[IU]/mL (ref 0.35–4.50)

## 2020-11-27 NOTE — Telephone Encounter (Signed)
Message left to call Randa Evens to see what the hold up is.  WE can not do any more on our part

## 2020-11-27 NOTE — Patient Instructions (Addendum)
check your blood sugar 4 times a day: before the 3 meals, and at bedtime.  also check if you have symptoms of your blood sugar being too high or too low.  please keep a record of the readings and bring it to your next appointment here (or you can bring the meter itself).  You can write it on any piece of paper.  please call us sooner if your blood sugar goes below 70, or if you have a lot of readings over 200. Please take these pump settings: basal rate of 0.6 units/hr. bolus of 1 unit/12 grams carbohydrate correction bolus (which some people call "sensitivity," or "insulin sensitivity ratio," or just "isr") of 1 unit for each 50 by which your glucose exceeds 100.   Please come back for a follow-up appointment in 1 month.

## 2020-11-27 NOTE — Progress Notes (Signed)
Subjective:    Patient ID: William Fitzpatrick, male    DOB: [birth year], 64 y.o.   MRN: [id number]  HPI Pt returns for f/u of diabetes mellitus: DM type: Insulin-requiring type 2 DM Dx'ed: 1994 Complications: DR Therapy: insulin since 2010 GDM: never DKA: never Severe hypoglycemia: never Pancreatitis: never Pancreatic imaging: never SDOH: nnoe Other: he took pump rx in 2020 Interval history: he takes these settings: basal rate of 0.6 units/hr. bolus of 1 unit/15 grams carbohydrate correction bolus (which some people call "sensitivity," or "insulin sensitivity ratio," or just "isr") of 1 unit for each 50 by which your glucose exceeds 100 He has not yet obtained the continuous glucose monitor.  Meter is downloaded today, and the printout is scanned into the record.  cbg's vary from 100-300.  It is in general higher as the day goes on, but not necessarily so.  Hypoglycemia resolved with the reduction in basal to 0.6 units/HR.   TDD is 26 units (57% basal).  He take 3 boluses per day. Past Medical History:  Diagnosis Date  . Schizophrenia (HCC)     No past surgical history on file.  Social History   Socioeconomic History  . Marital status: Divorced    Spouse name: Not on file  . Number of children: Not on file  . Years of education: Not on file  . Highest education level: Not on file  Occupational History  . Not on file  Tobacco Use  . Smoking status: Current Every Day Smoker    Types: Cigarettes  . Smokeless tobacco: Never Used  Vaping Use  . Vaping Use: Never used  Substance and Sexual Activity  . Alcohol use: Yes  . Drug use: Not Currently  . Sexual activity: Not on file  Other Topics Concern  . Not on file  Social History Narrative  . Not on file   Social Determinants of Health   Financial Resource Strain: Not on file  Food Insecurity: Not on file  Transportation Needs: Not on file  Physical Activity: Not on file  Stress: Not on file  Social Connections: Not  on file  Intimate Partner Violence: Not on file    Current Outpatient Medications on File Prior to Visit  Medication Sig Dispense Refill  . amLODipine (NORVASC) 10 MG tablet Take 10 mg by mouth daily.    Marland Kitchen aspirin EC 81 MG tablet Take 81 mg by mouth daily.    . Continuous Blood Gluc Sensor (DEXCOM G6 SENSOR) MISC 1 Device by Does not apply route See admin instructions. 10 each 3  . hydrochlorothiazide (MICROZIDE) 12.5 MG capsule Take 12.5 mg by mouth daily.    Marland Kitchen levothyroxine (SYNTHROID) 75 MCG tablet Take 75 mcg by mouth daily before breakfast.    . losartan (COZAAR) 100 MG tablet Take 100 mg by mouth daily.    . metFORMIN (GLUCOPHAGE-XR) 500 MG 24 hr tablet Take 1,000 mg by mouth 2 (two) times daily.    Marland Kitchen OLANZapine (ZYPREXA) 20 MG tablet Take 10 mg by mouth at bedtime.    . polyethylene glycol (MIRALAX / GLYCOLAX) 17 g packet Take 17 g by mouth daily. 14 each 0  . traZODone (DESYREL) 150 MG tablet Take 75 mg by mouth at bedtime.    . Vitamin D, Ergocalciferol, (DRISDOL) 1.25 MG (50000 UT) CAPS capsule Take 50,000 Units by mouth every Tuesday.     No current facility-administered medications on file prior to visit.    No Known Allergies  Family History  Problem Relation Age of Onset  . Diabetes Neg Hx     BP 128/80   Pulse 88   Ht 5\' 8"  (1.727 m)   Wt 187 lb (84.8 kg)   SpO2 99%   BMI 28.43 kg/m    Review of Systems He denies hypoglycemia.      Objective:   Physical Exam VITAL SIGNS:  See vs page GENERAL: no distress Pulses: dorsalis pedis intact bilat.   MSK: no deformity of the feet CV: no leg edema Skin:  no ulcer on the feet.  normal color and temp on the feet. Neuro: sensation is intact to touch on the feet       Assessment & Plan:  Insulin-requiring type 2 DM, with DR: uncontrolled.   Patient Instructions  check your blood sugar 4 times a day: before the 3 meals, and at bedtime.  also check if you have symptoms of your blood sugar being too high or  too low.  please keep a record of the readings and bring it to your next appointment here (or you can bring the meter itself).  You can write it on any piece of paper.  please call sooner if your blood sugar goes below 70, or if you have a lot of readings over 200. Please take these pump settings: basal rate of 0.6 units/hr. bolus of 1 unit/12 grams carbohydrate correction bolus (which some people call "sensitivity," or "insulin sensitivity ratio," or just "isr") of 1 unit for each 50 by which your glucose exceeds 100.   Please come back for a follow-up appointment in 1 month.

## 2020-11-28 NOTE — Progress Notes (Signed)
Subjective:   Patient ID: William Fitzpatrick, male   DOB: 64 y.o.   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

## 2020-11-29 ENCOUNTER — Other Ambulatory Visit: Payer: Self-pay

## 2020-11-29 ENCOUNTER — Telehealth: Payer: Self-pay | Admitting: Endocrinology

## 2020-11-29 MED ORDER — INSULIN ASPART PROT & ASPART (70-30 MIX) 100 UNIT/ML ~~LOC~~ SUSP: SUBCUTANEOUS | 11 refills | Status: DC

## 2020-11-29 NOTE — Telephone Encounter (Signed)
REFILL REQUEST:  Novolog (vials)   Instead of [facility], this needs to go to:  [facility] [address] [place], [place] [address] Ph#  [phone]

## 2020-11-29 NOTE — Telephone Encounter (Signed)
RX sent to pharmacy  

## 2020-12-13 ENCOUNTER — Telehealth: Payer: Self-pay | Admitting: Endocrinology

## 2020-12-13 NOTE — Telephone Encounter (Signed)
Received fax - placing in [facility] box now.

## 2020-12-13 NOTE — Telephone Encounter (Signed)
Patient is now calling to see "what is going on and why we have not sent the information to [facility] yet" - I informed him that from where I can see, we have not gotten anything from them yet but we are working on it.

## 2020-12-13 NOTE — Telephone Encounter (Signed)
[facility] called checking to see if we've received CMN form for Dexcom and patient's most recent chart notes - they have faxed to both fax numbers and I don't see that we've gotten anything yet. Please advise.  Fax# for [facility] - [phone]

## 2020-12-18 ENCOUNTER — Telehealth: Payer: Self-pay | Admitting: Nutrition

## 2020-12-18 MED ORDER — INSULIN ASPART 100 UNIT/ML ~~LOC~~ SOLN: SUBCUTANEOUS | 99 refills | Status: DC

## 2020-12-18 NOTE — Telephone Encounter (Signed)
Ok, I have sent a prescription to your pharmacy 

## 2020-12-18 NOTE — Telephone Encounter (Signed)
Message left on machine wanting to know if he should be using the Novolog 70/30 insulin.  Tried to call him back X2  Telephone number busy 10:20 HF:[id number] to call back and 2X no answer, and again busy. 10:55AM: Tried again to call [employer] busy

## 2020-12-18 NOTE — Telephone Encounter (Signed)
Tried again to contact him.  Phone range but no answer and not able to leave a voice mail.

## 2020-12-18 NOTE — Telephone Encounter (Signed)
-----   Message from Jessica Priest, RN sent at 12/18/2020 10:19 AM EST ----- Message left on my machine that he picked up 70/30 Novolog.  He started back on his pump on 11/30.  Please call in Novolog.  I have not been able to call him back. Phone is busy

## 2020-12-18 NOTE — Telephone Encounter (Signed)
Sent/faxed [facility] healtcare care service complete form --faxed [phone]

## 2020-12-19 ENCOUNTER — Other Ambulatory Visit: Payer: Self-pay | Admitting: *Deleted

## 2020-12-19 DIAGNOSIS — E11319 Type 2 diabetes mellitus with unspecified diabetic retinopathy without macular edema: Secondary | ICD-10-CM

## 2020-12-19 MED ORDER — INSULIN ASPART 100 UNIT/ML ~~LOC~~ SOLN: SUBCUTANEOUS | 99 refills | Status: DC

## 2020-12-19 NOTE — Telephone Encounter (Signed)
Faxed last OV [phone] [name]

## 2020-12-19 NOTE — Telephone Encounter (Signed)
Patient reports that he has been using the 70/30 insulin in his pump.  He denies low blood sugars.   He was told to go to the pharmacy asap to get just Novolog insulin.  He reports that the [place] called him and can not fill this. It needs to go to the   [facility] on [address] in [place] Note sent to Dr. Everardo All

## 2020-12-19 NOTE — Telephone Encounter (Signed)
Sent Rx Novolog--[facility]

## 2020-12-19 NOTE — Telephone Encounter (Signed)
Patient called stating he needed the Novolog sent to the [facility] on [facility] [address].

## 2020-12-19 NOTE — Telephone Encounter (Signed)
[employer] called stating they received the CMN form but they did not get the most recent chart notes and asked if we could please fax that to # [phone] [name])

## 2020-12-25 ENCOUNTER — Other Ambulatory Visit: Payer: Self-pay

## 2020-12-27 ENCOUNTER — Ambulatory Visit (INDEPENDENT_AMBULATORY_CARE_PROVIDER_SITE_OTHER): Payer: Medicare Other | Admitting: Endocrinology

## 2020-12-27 ENCOUNTER — Encounter: Payer: Self-pay | Admitting: Endocrinology

## 2020-12-27 ENCOUNTER — Other Ambulatory Visit: Payer: Self-pay

## 2020-12-27 VITALS — BP 109/86 | HR 76 | Ht 68.0 in | Wt 187.0 lb

## 2020-12-27 DIAGNOSIS — E11319 Type 2 diabetes mellitus with unspecified diabetic retinopathy without macular edema: Secondary | ICD-10-CM | POA: Diagnosis not present

## 2020-12-27 DIAGNOSIS — Z794 Long term (current) use of insulin: Secondary | ICD-10-CM | POA: Diagnosis not present

## 2020-12-27 LAB — POCT GLYCOSYLATED HEMOGLOBIN (HGB A1C): Hemoglobin A1C: 8.9 % — AB (ref 4.0–5.6)

## 2020-12-27 MED ORDER — RYBELSUS 3 MG PO TABS: 3.0000 mg | ORAL_TABLET | Freq: Every day | ORAL | 3 refills | Status: DC

## 2020-12-27 MED ORDER — RYBELSUS 3 MG PO TABS: 3.0000 mg | ORAL_TABLET | Freq: Every day | ORAL | 11 refills | Status: DC

## 2020-12-27 NOTE — Patient Instructions (Addendum)
check your blood sugar 4 times a day: before the 3 meals, and at bedtime.  also check if you have symptoms of your blood sugar being too high or too low.  please keep a record of the readings and bring it to your next appointment here (or you can bring the meter itself).  You can write it on any piece of paper.  please call us sooner if your blood sugar goes below 70, or if you have a lot of readings over 200. Please continue these pump settings:  basal rate of 0.6 units/hr.  bolus of 1 unit/12 grams carbohydrate.   correction bolus (which some people call "sensitivity," or "insulin sensitivity ratio," or just "isr") of 1 unit for each 50 by which your glucose exceeds 100.   I have sent a prescription to your pharmacy, to add "Rybelsus." Please come back for a follow-up appointment in 2 months.

## 2020-12-27 NOTE — Progress Notes (Signed)
Subjective:    Patient ID: William Fitzpatrick, male    DOB: 1956-09-06, 65 y.o.   MRN: [id number]  HPI Pt returns for f/u of diabetes mellitus: DM type: Insulin-requiring type 2 DM Dx'ed: 1994 Complications: DR Therapy: insulin since 2010 (pump rx since 2021) DKA: never Severe hypoglycemia: never Pancreatitis: never Pancreatic imaging: never SDOH: none Other: he also took pump rx in 2020 Interval history: he takes these settings: basal rate of 0.6 units/hr. bolus of 1 unit/12 grams carbohydrate correction bolus (which some people call "sensitivity," or "insulin sensitivity ratio," or just "isr") of 1 unit for each 50 by which your glucose exceeds 100 He has not yet obtained the continuous glucose monitor.  Meter is downloaded today, and the printout is scanned into the record.  cbg's vary from 100-340.  It is in general higher as the day goes on, but not necessarily so.   TDD is 28 units (51% basal).  He take 2.5 boluses per day.   He seldom has hypoglycemia, and these episodes are mild.     Review of Systems He denies LOC    Objective:   Physical Exam VITAL SIGNS:  See vs page GENERAL: no distress Pulses: dorsalis pedis intact bilat.   MSK: no deformity of the feet CV: no leg edema Skin:  no ulcer on the feet.  normal color and temp on the feet. Neuro: sensation is intact to touch on the feet, but decreased from normal.  Past Medical History:  Diagnosis Date  . Schizophrenia (HCC)     No past surgical history on file.  Social History   Socioeconomic History  . Marital status: Divorced    Spouse name: Not on file  . Number of children: Not on file  . Years of education: Not on file  . Highest education level: Not on file  Occupational History  . Not on file  Tobacco Use  . Smoking status: Current Every Day Smoker    Types: Cigarettes  . Smokeless tobacco: Never Used  Vaping Use  . Vaping Use: Never used  Substance and Sexual Activity  . Alcohol use: Yes  .  Drug use: Not Currently  . Sexual activity: Not on file  Other Topics Concern  . Not on file  Social History Narrative  . Not on file   Social Determinants of Health   Financial Resource Strain: Not on file  Food Insecurity: Not on file  Transportation Needs: Not on file  Physical Activity: Not on file  Stress: Not on file  Social Connections: Not on file  Intimate Partner Violence: Not on file    Current Outpatient Medications on File Prior to Visit  Medication Sig Dispense Refill  . amLODipine (NORVASC) 10 MG tablet Take 10 mg by mouth daily.    Marland Kitchen aspirin EC 81 MG tablet Take 81 mg by mouth daily.    . Continuous Blood Gluc Sensor (DEXCOM G6 SENSOR) MISC 1 Device by Does not apply route See admin instructions. 10 each 3  . hydrochlorothiazide (MICROZIDE) 12.5 MG capsule Take 12.5 mg by mouth daily.    . insulin aspart (NOVOLOG) 100 UNIT/ML injection For use in pump, total of 60 units per day. 60 mL PRN  . levothyroxine (SYNTHROID) 75 MCG tablet Take 75 mcg by mouth daily before breakfast.    . losartan (COZAAR) 100 MG tablet Take 100 mg by mouth daily.    . metFORMIN (GLUCOPHAGE-XR) 500 MG 24 hr tablet Take 1,000 mg by mouth 2 (two)  times daily.    Marland Kitchen OLANZapine (ZYPREXA) 20 MG tablet Take 10 mg by mouth at bedtime.    . polyethylene glycol (MIRALAX / GLYCOLAX) 17 g packet Take 17 g by mouth daily. 14 each 0  . traZODone (DESYREL) 150 MG tablet Take 75 mg by mouth at bedtime.    . Vitamin D, Ergocalciferol, (DRISDOL) 1.25 MG (50000 UT) CAPS capsule Take 50,000 Units by mouth every Tuesday.     No current facility-administered medications on file prior to visit.    No Known Allergies  Family History  Problem Relation Age of Onset  . Diabetes Neg Hx     BP 109/86   Pulse 76   Ht 5\' 8"  (1.727 m)   Wt 187 lb (84.8 kg)   SpO2 99%   BMI 28.43 kg/m    Lab Results  Component Value Date   HGBA1C 8.9 (A) 12/27/2020   Lab Results  Component Value Date   CREATININE 0.77  11/27/2020   BUN 12 11/27/2020   NA 133 (L) 11/27/2020   K 3.6 11/27/2020   CL 98 11/27/2020   CO2 25 11/27/2020       Assessment & Plan:  Insulin-requiring type 2 DM, with DR: uncontrolled.  Hypoglycemia, due to insulin: we'll favor GLP rx rather than increasing insulin.   Patient Instructions  check your blood sugar 4 times a day: before the 3 meals, and at bedtime.  also check if you have symptoms of your blood sugar being too high or too low.  please keep a record of the readings and bring it to your next appointment here (or you can bring the meter itself).  You can write it on any piece of paper.  please call 11/29/2020 sooner if your blood sugar goes below 70, or if you have a lot of readings over 200. Please continue these pump settings:  basal rate of 0.6 units/hr.  bolus of 1 unit/12 grams carbohydrate.   correction bolus (which some people call "sensitivity," or "insulin sensitivity ratio," or just "isr") of 1 unit for each 50 by which your glucose exceeds 100.   I have sent a prescription to your pharmacy, to add "Rybelsus." Please come back for a follow-up appointment in 2 months.

## 2021-01-08 ENCOUNTER — Encounter: Payer: Self-pay | Admitting: *Deleted

## 2021-01-08 ENCOUNTER — Telehealth: Payer: Self-pay | Admitting: Endocrinology

## 2021-01-08 NOTE — Telephone Encounter (Signed)
Notified pt William Fitzpatrick complete form been re-faxed --[phone] and [phone].

## 2021-01-08 NOTE — Telephone Encounter (Signed)
Patient called asking if we have gotten a fax from [employer]? Please advise.

## 2021-01-09 ENCOUNTER — Telehealth: Payer: Self-pay | Admitting: Nutrition

## 2021-01-14 NOTE — Telephone Encounter (Signed)
[employer] called saying they did not receive LMN form and asked if we could refax to this number instead.  Fax# [phone]

## 2021-01-14 NOTE — Telephone Encounter (Signed)
Re-faxed complete form [phone]

## 2021-01-22 NOTE — Telephone Encounter (Signed)
2nd Message left to do tandem pump training

## 2021-01-28 ENCOUNTER — Encounter (HOSPITAL_COMMUNITY): Payer: Self-pay

## 2021-01-28 ENCOUNTER — Emergency Department (HOSPITAL_COMMUNITY): Payer: Medicare Other

## 2021-01-28 ENCOUNTER — Other Ambulatory Visit: Payer: Self-pay

## 2021-01-28 ENCOUNTER — Inpatient Hospital Stay (HOSPITAL_COMMUNITY)
Admission: EM | Admit: 2021-01-28 | Discharge: 2021-01-30 | DRG: 042 | Disposition: A | Discharge status: Home / Self Care | Payer: Medicare Other | Attending: Internal Medicine | Admitting: Internal Medicine

## 2021-01-28 DIAGNOSIS — Z7982 Long term (current) use of aspirin: Secondary | ICD-10-CM

## 2021-01-28 DIAGNOSIS — Z20822 Contact with and (suspected) exposure to covid-19: Secondary | ICD-10-CM | POA: Diagnosis present

## 2021-01-28 DIAGNOSIS — Z79899 Other long term (current) drug therapy: Secondary | ICD-10-CM

## 2021-01-28 DIAGNOSIS — I63532 Cerebral infarction due to unspecified occlusion or stenosis of left posterior cerebral artery: Principal | ICD-10-CM | POA: Diagnosis present

## 2021-01-28 DIAGNOSIS — Z794 Long term (current) use of insulin: Secondary | ICD-10-CM

## 2021-01-28 DIAGNOSIS — E039 Hypothyroidism, unspecified: Secondary | ICD-10-CM | POA: Diagnosis present

## 2021-01-28 DIAGNOSIS — H5340 Unspecified visual field defects: Secondary | ICD-10-CM | POA: Diagnosis present

## 2021-01-28 DIAGNOSIS — F200 Paranoid schizophrenia: Secondary | ICD-10-CM | POA: Diagnosis present

## 2021-01-28 DIAGNOSIS — E785 Hyperlipidemia, unspecified: Secondary | ICD-10-CM | POA: Diagnosis present

## 2021-01-28 DIAGNOSIS — E119 Type 2 diabetes mellitus without complications: Secondary | ICD-10-CM

## 2021-01-28 DIAGNOSIS — I169 Hypertensive crisis, unspecified: Secondary | ICD-10-CM | POA: Diagnosis present

## 2021-01-28 DIAGNOSIS — Z86711 Personal history of pulmonary embolism: Secondary | ICD-10-CM

## 2021-01-28 DIAGNOSIS — H547 Unspecified visual loss: Secondary | ICD-10-CM | POA: Diagnosis present

## 2021-01-28 DIAGNOSIS — Z7984 Long term (current) use of oral hypoglycemic drugs: Secondary | ICD-10-CM

## 2021-01-28 DIAGNOSIS — I639 Cerebral infarction, unspecified: Secondary | ICD-10-CM | POA: Diagnosis present

## 2021-01-28 DIAGNOSIS — I69392 Facial weakness following cerebral infarction: Secondary | ICD-10-CM

## 2021-01-28 DIAGNOSIS — E1165 Type 2 diabetes mellitus with hyperglycemia: Secondary | ICD-10-CM | POA: Diagnosis present

## 2021-01-28 DIAGNOSIS — Z79890 Hormone replacement therapy (postmenopausal): Secondary | ICD-10-CM

## 2021-01-28 DIAGNOSIS — H5347 Heteronymous bilateral field defects: Secondary | ICD-10-CM | POA: Diagnosis present

## 2021-01-28 DIAGNOSIS — F17290 Nicotine dependence, other tobacco product, uncomplicated: Secondary | ICD-10-CM | POA: Diagnosis present

## 2021-01-28 DIAGNOSIS — F209 Schizophrenia, unspecified: Secondary | ICD-10-CM | POA: Diagnosis present

## 2021-01-28 DIAGNOSIS — I69328 Other speech and language deficits following cerebral infarction: Secondary | ICD-10-CM

## 2021-01-28 DIAGNOSIS — F17210 Nicotine dependence, cigarettes, uncomplicated: Secondary | ICD-10-CM | POA: Diagnosis present

## 2021-01-28 DIAGNOSIS — I10 Essential (primary) hypertension: Secondary | ICD-10-CM | POA: Diagnosis present

## 2021-01-28 HISTORY — DX: Cerebral infarction, unspecified: I63.9

## 2021-01-28 LAB — CBC WITH DIFFERENTIAL/PLATELET
Abs Immature Granulocytes: 0.06 10*3/uL (ref 0.00–0.07)
Basophils Absolute: 0.1 10*3/uL (ref 0.0–0.1)
Basophils Relative: 1 %
Eosinophils Absolute: 0.2 10*3/uL (ref 0.0–0.5)
Eosinophils Relative: 2 %
HCT: 46.9 % (ref 39.0–52.0)
Hemoglobin: 16.4 g/dL (ref 13.0–17.0)
Immature Granulocytes: 1 %
Lymphocytes Relative: 25 %
Lymphs Abs: 2.6 10*3/uL (ref 0.7–4.0)
MCH: 32 pg (ref 26.0–34.0)
MCHC: 35 g/dL (ref 30.0–36.0)
MCV: 91.4 fL (ref 80.0–100.0)
Monocytes Absolute: 0.6 10*3/uL (ref 0.1–1.0)
Monocytes Relative: 6 %
Neutro Abs: 6.7 10*3/uL (ref 1.7–7.7)
Neutrophils Relative %: 65 %
Platelets: 173 10*3/uL (ref 150–400)
RBC: 5.13 MIL/uL (ref 4.22–5.81)
RDW: 12 % (ref 11.5–15.5)
WBC: 10.3 10*3/uL (ref 4.0–10.5)
nRBC: 0 % (ref 0.0–0.2)

## 2021-01-28 LAB — BASIC METABOLIC PANEL
Anion gap: 10 (ref 5–15)
BUN: 15 mg/dL (ref 8–23)
CO2: 23 mmol/L (ref 22–32)
Calcium: 9.4 mg/dL (ref 8.9–10.3)
Chloride: 101 mmol/L (ref 98–111)
Creatinine, Ser: 0.87 mg/dL (ref 0.61–1.24)
GFR, Estimated: >60 mL/min (ref >60)
Glucose, Bld: 220 mg/dL — ABNORMAL HIGH (ref 70–99)
Potassium: 3.6 mmol/L (ref 3.5–5.1)
Sodium: 134 mmol/L — ABNORMAL LOW (ref 135–145)

## 2021-01-28 LAB — PROTIME-INR
INR: 1 (ref 0.8–1.2)
Prothrombin Time: 12.6 seconds (ref 11.4–15.2)

## 2021-01-28 MED ORDER — ASPIRIN 81 MG PO CHEW
324.0000 mg | CHEWABLE_TABLET | Freq: Once | ORAL | Status: AC
  Administered 2021-01-28: 324 mg via ORAL
  Filled 2021-01-28: qty 4

## 2021-01-28 MED ORDER — ASPIRIN EC 81 MG PO TBEC
81.0000 mg | DELAYED_RELEASE_TABLET | Freq: Every day | ORAL | Status: DC
  Administered 2021-01-29 – 2021-01-30 (×2): 81 mg via ORAL
  Filled 2021-01-28 (×2): qty 1

## 2021-01-28 NOTE — ED Notes (Signed)
Pt reports sx that began today at 1000-1100 where partial vision loss when looking to the right in both eyes. Pt requesting to stay in all own clothes for time being.

## 2021-01-28 NOTE — ED Provider Notes (Signed)
[facility]-EMERGENCY DEPT Provider Note   CSN: [id number] Arrival date & time: 01/28/21  2013     History Chief Complaint  Patient presents with  . Eye Problem    William Fitzpatrick is a 65 y.o. male past medical history of schizophrenia, type 2 diabetes, hypertension, tobacco use, presenting to the emergency department with complaint of sudden onset of right sided visual field loss.  Patient states around 10 AM this morning his symptoms began.  He states when he looks forward he is unable to see in the right field for example when he is reading a word, he cannot see the right half of the word without shifting his gaze.  He denies any associated double vision, no difficulty speaking or walking, no new numbness or weakness in his extremities or face.  Denies headache.  Denies chest pain.  This has never happened before.  No recent head or eye trauma.  The history is provided by the patient.       Past Medical History:  Diagnosis Date  . Schizophrenia (HCC)   . Stroke [facility])     Patient Active Problem List   Diagnosis Date Noted  . 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 Diabetes (HCC) 10/27/2020  . Schizophrenia (HCC) 10/01/2019  . Suicidal thoughts     History reviewed. No pertinent surgical history.     Family History  Problem Relation Age of Onset  . Diabetes Neg Hx     Social History   Tobacco Use  . Smoking status: Current Every Day Smoker    Types: Cigarettes  . Smokeless tobacco: Never Used  Vaping Use  . Vaping Use: Never used  Substance Use Topics  . Alcohol use: Yes  . Drug use: Not Currently    Home Medications Prior to Admission medications   Medication Sig Start Date End Date Taking? Authorizing Provider  amLODipine (NORVASC) 10 MG tablet Take 10 mg by mouth daily.    [provider]  aspirin EC 81 MG tablet Take 81 mg by mouth daily.    [provider]  Continuous Blood Gluc Sensor (DEXCOM G6 SENSOR) MISC 1 Device by Does not apply route See admin instructions. 11/12/20   Romero Belling, MD  hydrochlorothiazide (MICROZIDE) 12.5 MG capsule Take 12.5 mg by mouth daily.    [provider]  insulin aspart (NOVOLOG) 100 UNIT/ML injection For use in pump, total of 60 units per day. 12/19/20   Romero Belling, MD  levothyroxine (SYNTHROID) 75 MCG tablet Take 75 mcg by mouth daily before breakfast.    [provider]  metFORMIN (GLUCOPHAGE-XR) 500 MG 24 hr tablet Take 1,000 mg by mouth 2 (two) times daily. 08/14/20   [provider]  OLANZapine (ZYPREXA) 20 MG tablet Take 10 mg by mouth at bedtime.    [provider]  polyethylene glycol (MIRALAX / GLYCOLAX) 17 g packet Take 17 g by mouth daily. 10/03/19   Chales Abrahams, NP  Semaglutide (RYBELSUS) 3 MG TABS Take 3 mg by mouth daily. 12/27/20   Romero Belling, MD    Allergies    Patient has no known allergies.  Review of Systems   Review of Systems  Eyes: Positive for visual disturbance.  All other systems reviewed and are negative.   Physical Exam Updated Vital Signs BP (!) 152/84 (BP Location: Left Arm)   Pulse 79   Temp 98.3 F (36.8 C) (Oral)  Resp 16   SpO2 95%   Physical Exam Vitals and nursing note reviewed.  Constitutional:      General: He is not in acute distress.    Appearance: He is well-developed and well-nourished. He is not ill-appearing.  HENT:     Head: Normocephalic and atraumatic.  Eyes:     Conjunctiva/sclera: Conjunctivae normal.  Cardiovascular:     Rate and Rhythm: Normal rate and regular rhythm.  Pulmonary:     Effort: Pulmonary effort is normal. No respiratory distress.     Breath sounds: Normal breath sounds.  Abdominal:     Palpations: Abdomen is soft.  Musculoskeletal:     Cervical back: Normal range of motion and neck supple.  Skin:    General: Skin is warm.  Neurological:     Mental Status: He is  alert.     Comments: Mental Status:  Alert, oriented, thought content appropriate, able to give a coherent history. Speech fluent without evidence of aphasia. Able to follow 2 step commands without difficulty.  Cranial Nerves:  II:  Peripheral visual fields with slight right peripheral field loss, seems more so in the right lower field bilaterally. Pupils equal, round, reactive to light III,IV, VI: ptosis not present, extra-ocular motions intact bilaterally  V,VII: smile symmetric, facial light touch sensation equal VIII: hearing grossly normal to voice  X: uvula elevates symmetrically  XI: bilateral shoulder shrug symmetric and strong XII: midline tongue extension without fassiculations Motor:  Normal tone. 5/5 strength in upper and lower extremities bilaterally including strong and equal grip strength and dorsiflexion/plantar flexion Sensory: grossly normal in all extremities.  Cerebellar: normal finger-to-nose with bilateral upper extremities Gait: normal gait and balance CV: distal pulses palpable throughout    Psychiatric:        Mood and Affect: Mood and affect normal.        Behavior: Behavior normal.     ED Results / Procedures / Treatments   Labs (all labs ordered are listed, but only abnormal results are displayed) Labs Reviewed  BASIC METABOLIC PANEL - Abnormal; Notable for the following components:      Result Value   Sodium 134 (*)    Glucose, Bld 220 (*)    All other components within normal limits  SARS CORONAVIRUS 2 (TAT 6-24 HRS)  CBC WITH DIFFERENTIAL/PLATELET  PROTIME-INR    EKG EKG Interpretation  Date/Time:  Monday January 28 2021 22:38:15 EST Ventricular Rate:  66 PR Interval:  170 QRS Duration: 84 QT Interval:  420 QTC Calculation: 440 R Axis:   -6 Text Interpretation: Normal sinus rhythm Nonspecific T wave abnormality Abnormal ECG NO STEMI Confirmed by Alvester Chou [phone]) on 01/28/2021 10:49:49 PM   Radiology CT Head Wo  Contrast  Result Date: 01/28/2021 CLINICAL DATA:  Visual deficit for 10 hours EXAM: CT HEAD WITHOUT CONTRAST TECHNIQUE: Contiguous axial images were obtained from the base of the skull through the vertex without intravenous contrast. COMPARISON:  None. FINDINGS: Brain: Hypodensity within the left occipital cortex and subcortical white matter consistent with acute or subacute infarct. Chronic ischemic changes are seen within the right basal ganglia. No acute hemorrhage. Lateral ventricles and remaining midline structures are unremarkable. No acute extra-axial fluid collections. No mass effect. Vascular: No hyperdense vessel or unexpected calcification. Skull: Normal. Negative for fracture or focal lesion. Sinuses/Orbits: No acute finding. Other: None. IMPRESSION: 1. Acute to subacute left occipital cortical infarct. 2. No acute hemorrhage. Electronically Signed   By: Sharlet Salina M.D.   On:  01/28/2021 22:28    Procedures Procedures   Medications Ordered in ED Medications  aspirin EC tablet 81 mg (has no administration in time range)  aspirin chewable tablet 324 mg (324 mg Oral Given 01/28/21 2258)    ED Course  I have reviewed the triage vital signs and the nursing notes.  Pertinent labs & imaging results that were available during my care of the patient were reviewed by me and considered in my medical decision making (see chart for details).  Clinical Course as of 01/28/21 2353  Mon Jan 28, 2021  2235 Consulted with Dr. Amada Jupiter with neurology.  Recommends admission and transfer to [facility] for further management of his acute stroke.  As he is outside of the window for tPA, will start aspirin.  [JR]  2240 64 yo male here with hemianopsia in right eye onset 10 am today, found to have left occipital infarct on CT.  Outside window for tPA on arrival, and does not meet VAN LVO criteria.  PA provider to consult with neurology, anticipate medical admission.  ECG per my interpretation shows NSR,  HR 66, Qtc 440, no STEMI. [MT]    Clinical Course User Index [JR] Antoinette Borgwardt, [place] N, PA-C [MT] Terald Sleeper, MD   MDM Rules/Calculators/A&P                          Patient is a 65 year old man with history of hypertension diabetes, tobacco use, presenting with sudden onset of right homonymous hemianopsia that began at 10:00 this morning.  He presents at 8 PM this evening, Van negative, outside the window for TPA.  He has no other focal neuro deficits on examination other than the right homonymous hemianopsia.  He does have CT scan obtained which is consistent with left occipital cortical infarct, consistent with patient's presentation today.  Consult was placed with neurologist Dr. Amada Jupiter.  Patient will be admitted to [facility] under the hospitalist service.  Will give aspirin per neurology instruction.  Patient informed of diagnosis and is agreeable with care plan for admission and transfer.  Final Clinical Impression(s) / ED Diagnoses Final diagnoses:  Cerebrovascular accident (CVA) due to occlusion of left posterior cerebral artery [facility])    Rx / DC Orders ED Discharge Orders    None       Jaryd Drew, [place] N, PA-C 01/28/21 2354    Terald Sleeper, MD 01/29/21 [phone]

## 2021-01-28 NOTE — ED Notes (Signed)
Pt removed Dexcom glucose monitor. Per MD insulin pump may remain in place. Currently on RLQ.

## 2021-01-28 NOTE — ED Triage Notes (Signed)
Patient arrived with complaints of a blind spot in his vision that started roughly 10 hours ago. No other complaints at this time. Declines any pain.

## 2021-01-29 ENCOUNTER — Observation Stay (HOSPITAL_COMMUNITY): Payer: Medicare Other

## 2021-01-29 ENCOUNTER — Encounter (HOSPITAL_COMMUNITY): Payer: Self-pay | Admitting: Internal Medicine

## 2021-01-29 ENCOUNTER — Observation Stay (HOSPITAL_BASED_OUTPATIENT_CLINIC_OR_DEPARTMENT_OTHER): Payer: Medicare Other

## 2021-01-29 DIAGNOSIS — Z794 Long term (current) use of insulin: Secondary | ICD-10-CM | POA: Diagnosis not present

## 2021-01-29 DIAGNOSIS — E039 Hypothyroidism, unspecified: Secondary | ICD-10-CM | POA: Diagnosis present

## 2021-01-29 DIAGNOSIS — Z7982 Long term (current) use of aspirin: Secondary | ICD-10-CM | POA: Diagnosis not present

## 2021-01-29 DIAGNOSIS — Z86711 Personal history of pulmonary embolism: Secondary | ICD-10-CM | POA: Diagnosis not present

## 2021-01-29 DIAGNOSIS — F17290 Nicotine dependence, other tobacco product, uncomplicated: Secondary | ICD-10-CM | POA: Diagnosis not present

## 2021-01-29 DIAGNOSIS — Z7984 Long term (current) use of oral hypoglycemic drugs: Secondary | ICD-10-CM | POA: Diagnosis not present

## 2021-01-29 DIAGNOSIS — I63532 Cerebral infarction due to unspecified occlusion or stenosis of left posterior cerebral artery: Secondary | ICD-10-CM | POA: Diagnosis present

## 2021-01-29 DIAGNOSIS — H547 Unspecified visual loss: Secondary | ICD-10-CM | POA: Diagnosis present

## 2021-01-29 DIAGNOSIS — E785 Hyperlipidemia, unspecified: Secondary | ICD-10-CM | POA: Diagnosis present

## 2021-01-29 DIAGNOSIS — Z79890 Hormone replacement therapy (postmenopausal): Secondary | ICD-10-CM | POA: Diagnosis not present

## 2021-01-29 DIAGNOSIS — E1165 Type 2 diabetes mellitus with hyperglycemia: Secondary | ICD-10-CM | POA: Diagnosis present

## 2021-01-29 DIAGNOSIS — I69392 Facial weakness following cerebral infarction: Secondary | ICD-10-CM | POA: Diagnosis not present

## 2021-01-29 DIAGNOSIS — Z79899 Other long term (current) drug therapy: Secondary | ICD-10-CM | POA: Diagnosis not present

## 2021-01-29 DIAGNOSIS — H5340 Unspecified visual field defects: Secondary | ICD-10-CM | POA: Diagnosis present

## 2021-01-29 DIAGNOSIS — I639 Cerebral infarction, unspecified: Secondary | ICD-10-CM | POA: Diagnosis present

## 2021-01-29 DIAGNOSIS — I169 Hypertensive crisis, unspecified: Secondary | ICD-10-CM | POA: Diagnosis present

## 2021-01-29 DIAGNOSIS — H5347 Heteronymous bilateral field defects: Secondary | ICD-10-CM | POA: Diagnosis present

## 2021-01-29 DIAGNOSIS — F17210 Nicotine dependence, cigarettes, uncomplicated: Secondary | ICD-10-CM | POA: Diagnosis present

## 2021-01-29 DIAGNOSIS — I10 Essential (primary) hypertension: Secondary | ICD-10-CM

## 2021-01-29 DIAGNOSIS — I69328 Other speech and language deficits following cerebral infarction: Secondary | ICD-10-CM | POA: Diagnosis not present

## 2021-01-29 DIAGNOSIS — Z20822 Contact with and (suspected) exposure to covid-19: Secondary | ICD-10-CM | POA: Diagnosis present

## 2021-01-29 DIAGNOSIS — F209 Schizophrenia, unspecified: Secondary | ICD-10-CM | POA: Diagnosis present

## 2021-01-29 LAB — CBC WITH DIFFERENTIAL/PLATELET
Abs Immature Granulocytes: 0.11 10*3/uL — ABNORMAL HIGH (ref 0.00–0.07)
Basophils Absolute: 0.1 10*3/uL (ref 0.0–0.1)
Basophils Relative: 1 %
Eosinophils Absolute: 0.1 10*3/uL (ref 0.0–0.5)
Eosinophils Relative: 2 %
HCT: 43.7 % (ref 39.0–52.0)
Hemoglobin: 16 g/dL (ref 13.0–17.0)
Immature Granulocytes: 1 %
Lymphocytes Relative: 28 %
Lymphs Abs: 2.4 10*3/uL (ref 0.7–4.0)
MCH: 32.3 pg (ref 26.0–34.0)
MCHC: 36.6 g/dL — ABNORMAL HIGH (ref 30.0–36.0)
MCV: 88.3 fL (ref 80.0–100.0)
Monocytes Absolute: 0.5 10*3/uL (ref 0.1–1.0)
Monocytes Relative: 6 %
Neutro Abs: 5.6 10*3/uL (ref 1.7–7.7)
Neutrophils Relative %: 62 %
Platelets: 177 10*3/uL (ref 150–400)
RBC: 4.95 MIL/uL (ref 4.22–5.81)
RDW: 12.1 % (ref 11.5–15.5)
WBC: 8.8 10*3/uL (ref 4.0–10.5)
nRBC: 0 % (ref 0.0–0.2)

## 2021-01-29 LAB — COMPREHENSIVE METABOLIC PANEL
ALT: 18 U/L (ref 0–44)
AST: 16 U/L (ref 15–41)
Albumin: 3.7 g/dL (ref 3.5–5.0)
Alkaline Phosphatase: 91 U/L (ref 38–126)
Anion gap: 11 (ref 5–15)
BUN: 12 mg/dL (ref 8–23)
CO2: 20 mmol/L — ABNORMAL LOW (ref 22–32)
Calcium: 9.3 mg/dL (ref 8.9–10.3)
Chloride: 104 mmol/L (ref 98–111)
Creatinine, Ser: 0.71 mg/dL (ref 0.61–1.24)
GFR, Estimated: >60 mL/min (ref >60)
Glucose, Bld: 162 mg/dL — ABNORMAL HIGH (ref 70–99)
Potassium: 3.5 mmol/L (ref 3.5–5.1)
Sodium: 135 mmol/L (ref 135–145)
Total Bilirubin: 0.6 mg/dL (ref 0.3–1.2)
Total Protein: 6.3 g/dL — ABNORMAL LOW (ref 6.5–8.1)

## 2021-01-29 LAB — LIPID PANEL
Cholesterol: 199 mg/dL (ref 0–200)
HDL: 44 mg/dL (ref >40)
LDL Cholesterol: 133 mg/dL — ABNORMAL HIGH (ref 0–99)
Total CHOL/HDL Ratio: 4.5 RATIO
Triglycerides: 112 mg/dL (ref <150)
VLDL: 22 mg/dL (ref 0–40)

## 2021-01-29 LAB — GLUCOSE, CAPILLARY
Glucose-Capillary: 160 mg/dL — ABNORMAL HIGH (ref 70–99)
Glucose-Capillary: 173 mg/dL — ABNORMAL HIGH (ref 70–99)
Glucose-Capillary: 254 mg/dL — ABNORMAL HIGH (ref 70–99)
Glucose-Capillary: 224 mg/dL — ABNORMAL HIGH (ref 70–99)
Glucose-Capillary: 216 mg/dL — ABNORMAL HIGH (ref 70–99)

## 2021-01-29 LAB — RAPID URINE DRUG SCREEN, HOSP PERFORMED
Amphetamines: NOT DETECTED
Barbiturates: NOT DETECTED
Benzodiazepines: NOT DETECTED
Cocaine: NOT DETECTED
Opiates: NOT DETECTED
Tetrahydrocannabinol: NOT DETECTED

## 2021-01-29 LAB — HEMOGLOBIN A1C
Hgb A1c MFr Bld: 9.4 % — ABNORMAL HIGH (ref 4.8–5.6)
Mean Plasma Glucose: 223 mg/dL

## 2021-01-29 LAB — HIV ANTIBODY (ROUTINE TESTING W REFLEX): HIV Screen 4th Generation wRfx: NONREACTIVE

## 2021-01-29 LAB — ECHOCARDIOGRAM COMPLETE BUBBLE STUDY
Area-P 1/2: 2.16 cm2
S' Lateral: 3.9 cm

## 2021-01-29 LAB — SARS CORONAVIRUS 2 (TAT 6-24 HRS): SARS Coronavirus 2: NEGATIVE

## 2021-01-29 MED ORDER — INSULIN ASPART 100 UNIT/ML ~~LOC~~ SOLN
0.0000 [IU] | Freq: Every day | SUBCUTANEOUS | Status: DC
  Administered 2021-01-29: 2 [IU] via SUBCUTANEOUS

## 2021-01-29 MED ORDER — INSULIN ASPART 100 UNIT/ML ~~LOC~~ SOLN
0.0000 [IU] | Freq: Three times a day (TID) | SUBCUTANEOUS | Status: DC
  Administered 2021-01-30: 3 [IU] via SUBCUTANEOUS
  Administered 2021-01-30: 2 [IU] via SUBCUTANEOUS

## 2021-01-29 MED ORDER — INSULIN ASPART 100 UNIT/ML ~~LOC~~ SOLN
4.0000 [IU] | Freq: Three times a day (TID) | SUBCUTANEOUS | Status: DC
  Administered 2021-01-29 – 2021-01-30 (×3): 4 [IU] via SUBCUTANEOUS

## 2021-01-29 MED ORDER — INSULIN PUMP
Freq: Three times a day (TID) | SUBCUTANEOUS | Status: DC
  Filled 2021-01-29: qty 1

## 2021-01-29 MED ORDER — ROSUVASTATIN CALCIUM 20 MG PO TABS
20.0000 mg | ORAL_TABLET | Freq: Every day | ORAL | Status: DC
  Administered 2021-01-29 – 2021-01-30 (×2): 20 mg via ORAL
  Filled 2021-01-29 (×2): qty 1

## 2021-01-29 MED ORDER — ACETAMINOPHEN 160 MG/5ML PO SOLN: 650.0000 mg | ORAL | Status: DC | PRN

## 2021-01-29 MED ORDER — INSULIN DETEMIR 100 UNIT/ML ~~LOC~~ SOLN
15.0000 [IU] | Freq: Every day | SUBCUTANEOUS | Status: DC
  Administered 2021-01-29: 15 [IU] via SUBCUTANEOUS
  Filled 2021-01-29 (×2): qty 0.15

## 2021-01-29 MED ORDER — ACETAMINOPHEN 650 MG RE SUPP: 650.0000 mg | RECTAL | Status: DC | PRN

## 2021-01-29 MED ORDER — CLOPIDOGREL BISULFATE 75 MG PO TABS
300.0000 mg | ORAL_TABLET | Freq: Once | ORAL | Status: AC
  Administered 2021-01-29: 300 mg via ORAL
  Filled 2021-01-29: qty 4

## 2021-01-29 MED ORDER — ACETAMINOPHEN 325 MG PO TABS: 650.0000 mg | ORAL_TABLET | ORAL | Status: DC | PRN

## 2021-01-29 MED ORDER — CLOPIDOGREL BISULFATE 75 MG PO TABS
75.0000 mg | ORAL_TABLET | Freq: Every day | ORAL | Status: DC
  Administered 2021-01-30: 75 mg via ORAL
  Filled 2021-01-29: qty 1

## 2021-01-29 MED ORDER — INSULIN ASPART 100 UNIT/ML ~~LOC~~ SOLN: 0.0000 [IU] | Freq: Three times a day (TID) | SUBCUTANEOUS | Status: DC

## 2021-01-29 MED ORDER — STROKE: EARLY STAGES OF RECOVERY BOOK
Freq: Once | Status: AC
  Filled 2021-01-29: qty 1

## 2021-01-29 MED ORDER — SODIUM CHLORIDE 0.9 % IV SOLN: INTRAVENOUS | Status: DC

## 2021-01-29 MED ORDER — OLANZAPINE 2.5 MG PO TABS
10.0000 mg | ORAL_TABLET | Freq: Every day | ORAL | Status: DC
  Administered 2021-01-29 (×2): 10 mg via ORAL
  Filled 2021-01-29 (×2): qty 4

## 2021-01-29 MED ORDER — INSULIN DETEMIR 100 UNIT/ML ~~LOC~~ SOLN
10.0000 [IU] | Freq: Every day | SUBCUTANEOUS | Status: DC
  Filled 2021-01-29: qty 0.1

## 2021-01-29 MED ORDER — LEVOTHYROXINE SODIUM 75 MCG PO TABS
75.0000 ug | ORAL_TABLET | Freq: Every day | ORAL | Status: DC
  Administered 2021-01-29 – 2021-01-30 (×2): 75 ug via ORAL
  Filled 2021-01-29 (×2): qty 1

## 2021-01-29 MED ORDER — IOHEXOL 350 MG/ML SOLN
80.0000 mL | Freq: Once | INTRAVENOUS | Status: AC | PRN
  Administered 2021-01-29: 80 mL via INTRAVENOUS

## 2021-01-29 MED ORDER — NICOTINE 21 MG/24HR TD PT24
21.0000 mg | MEDICATED_PATCH | Freq: Every day | TRANSDERMAL | Status: DC
  Filled 2021-01-29 (×2): qty 1

## 2021-01-29 NOTE — Evaluation (Signed)
Occupational Therapy Evaluation Patient Details Name: William Fitzpatrick MRN: [id number] DOB: January 03, 1956 Today's Date: 01/29/2021    History of Present Illness Patient is a 65 y/o male who presents with vision changes. Head CT- acute to subacute infarct in left occipital lobe. PMH includes schizophernia and stroke.   Clinical Impression   PTA patient was living alone in an apartment with level entry and was independent with ADLs/IADLs without AD. Patient was driving. Patient with current deficits including decreased sitting/standing balance, decreased cognition, and R homonymous hemianopsia requiring Min guard to supervision A overall for ADLs, ADL transfers, and functional mobility without AD. Patient with good knowledge of current deficits stating, "I feel like my speech isn't quite right and it's taking me longer to think about things." Patient would benefit from continued acute OT services to maximize safety and independence with self-care tasks in prep for safe return home. Patient would greatly benefit from OPOT for vision if transportation can be arranged. Patient states that he has no one that can provide transportation as all his family lives in [place]. OT will continue to follow acutely.     Follow Up Recommendations  Outpatient OT (Vision)    Equipment Recommendations  None recommended by OT    Recommendations for Other Services       Precautions / Restrictions Precautions Precautions: Fall Precaution Comments: R homonymous hemianopsia Restrictions Weight Bearing Restrictions: No      Mobility Bed Mobility Overal bed mobility: Needs Assistance Bed Mobility: Sit to Supine     Supine to sit: Min guard     General bed mobility comments: HOB flat with Min guard and increased time to elevate trunk.    Transfers Overall transfer level: Needs assistance Equipment used: None Transfers: Sit to/from Stand Sit to Stand: Min guard         General transfer comment: Min  guard for safety.    Balance Overall balance assessment: Needs assistance Sitting-balance support: Feet supported;No upper extremity supported Sitting balance-Leahy Scale: Fair Sitting balance - Comments: Patient with several episodes of slow posterior LOB during MMT and LB dressing to doff/don footwear.   Standing balance support: During functional activity Standing balance-Leahy Scale: Fair Standing balance comment: Able to stand without support but demonstrates some balance deficits during dynamic tasks but able to recover.                           ADL either performed or assessed with clinical judgement   ADL Overall ADL's : Needs assistance/impaired     Grooming: Supervision/safety;Sitting Grooming Details (indicate cue type and reason): Patient able to sequence task without cueing. Able to locate ADL items on sink surface with increased time. Supervision A for safety.             Lower Body Dressing: Sit to/from stand;Min guard Lower Body Dressing Details (indicate cue type and reason): Patient with slow posterior LOB x3 while doffing/donning footwear seated EOB. Toilet Transfer: Hydrographic surveyor Details (indicate cue type and reason): Simulated with transfer to recliner. Min guard for steadying.         Functional mobility during ADLs: Min guard General ADL Comments: Patient limited by R hemianopsia, decreased cognition, and speech deficits. Patient demonstrates knowledge of current deficits.     Vision Baseline Vision/History: Wears glasses Wears Glasses: At all times Patient Visual Report: Other (comment) (Loss of vision on R in both eyes.) Vision Assessment?: Yes Eye Alignment: Within Functional Limits Ocular  Range of Motion: Within Functional Limits Alignment/Gaze Preference: Within Defined Limits Tracking/Visual Pursuits: Decreased smoothness of horizontal tracking;Decreased smoothness of vertical tracking;Decreased smoothness of eye  movement to RIGHT superior field;Decreased smoothness of eye movement to RIGHT inferior field Saccades: Additional eye shifts occurred during testing;Decreased speed of saccadic movement Visual Fields: Right homonymous hemianopsia     Perception Perception Perception Tested?: Yes Comments: Attends to R visual field but requires increased time to locate ADL items on R side of sink.   Praxis Praxis Praxis tested?: Within functional limits    Pertinent Vitals/Pain Pain Assessment: No/denies pain     Hand Dominance Right   Extremity/Trunk Assessment Upper Extremity Assessment Upper Extremity Assessment: Overall WFL for tasks assessed   Lower Extremity Assessment Lower Extremity Assessment: Overall WFL for tasks assessed   Cervical / Trunk Assessment Cervical / Trunk Assessment: Normal   Communication Communication Communication: Other (comment) (Slurred speech, difficult to understand.)   Cognition Arousal/Alertness: Awake/alert Behavior During Therapy: Flat affect Overall Cognitive Status: Impaired/Different from baseline Area of Impairment: Memory                     Memory: Decreased short-term memory         General Comments: Patient with decreased STM with ability to recall 2/3 words on BIMs. Patient states "It's taking me a little longer to think about things and my speech just isn't quite right" demonstrating knowledge of current deficits.   General Comments  VSS on RA. Wears glasses at baseline. Reports difficulty seeing out of right visual field when reading things, having to adjust eyes to catch right side of word.    Exercises     Shoulder Instructions      Home Living Family/patient expects to be discharged to:: Private residence Living Arrangements: Alone Available Help at Discharge: Other (Comment) (No one. Family lives in [place].) Type of Home: Apartment Home Access: Level entry     Home Layout: One level     Bathroom Shower/Tub:  Chief Strategy Officer: Standard Bathroom Accessibility: No   Home Equipment: None          Prior Functioning/Environment Level of Independence: Independent        Comments: Retired. Drives. Does IADLs/ADLs.        OT Problem List: Impaired balance (sitting and/or standing);Impaired vision/perception      OT Treatment/Interventions: Self-care/ADL training;Therapeutic exercise;Neuromuscular education;Therapeutic activities;Visual/perceptual remediation/compensation;Patient/family education;Balance training    OT Goals(Current goals can be found in the care plan section) Acute Rehab OT Goals Patient Stated Goal: to get better and go home OT Goal Formulation: With patient Time For Goal Achievement: 02/12/21 Potential to Achieve Goals: Good ADL Goals Pt Will Transfer to Toilet: Independently;ambulating Pt Will Perform Toileting - Clothing Manipulation and hygiene: Independently;sit to/from stand Additional ADL Goal #1: Patient will recall and utilize 3 compensatory vision strategies without cueing during ADLs.  OT Frequency: Min 3X/week   Barriers to D/C: Decreased caregiver support  Lives alone.       Co-evaluation              AM-PAC OT "6 Clicks" Daily Activity     Outcome Measure Help from another person eating meals?: None Help from another person taking care of personal grooming?: A Little Help from another person toileting, which includes using toliet, bedpan, or urinal?: A Little Help from another person bathing (including washing, rinsing, drying)?: A Little Help from another person to put on and taking off regular upper  body clothing?: None Help from another person to put on and taking off regular lower body clothing?: A Little 6 Click Score: 20   End of Session Equipment Utilized During Treatment: Gait belt Nurse Communication: Mobility status  Activity Tolerance: Patient tolerated treatment well Patient left: in chair;with call  bell/phone within reach;with chair alarm set  OT Visit Diagnosis: Unsteadiness on feet (R26.81);Low vision, both eyes (H54.2)                Time: 1610-9604 OT Time Calculation (min): 24 min Charges:  OT General Charges $OT Visit: 1 Visit OT Evaluation $OT Eval Low Complexity: 1 Low OT Treatments $Self Care/Home Management : 8-22 mins  Ivorie Uplinger H. OTR/L Supplemental OT, Department of rehab services [phone]  Huda Petrey R H. 01/29/2021, 9:41 AM

## 2021-01-29 NOTE — Progress Notes (Addendum)
Patient seen and examined, admitted this AM by Dr Leafy Half.   Patient with prior history of CVA, not taking aspirin or statin presented with acute vision changes on the right.  Work-up so far showed acute CVA in the left occipital region.  Outside the window for IV TPA.  BP (!) 113/55 (BP Location: Right Arm)   Pulse 62   Temp 97.9 F (36.6 C) (Axillary)   Resp 19   Wt 85.9 kg   SpO2 95%   BMI 28.79 kg/m  Per patient, vision changes still persisting  A/P Acute ischemic stroke in left occipital region -CT head showed acute to subacute left occipital cortical infarct.  Neurology was consulted.  Started on aspirin and Plavix, not on ASA prior to admission -MRI brain pending, follow 2D echo -CTA head and neck showed no intracranial arterial occlusion, moderate stenosis of communicating segment of distal left ICA.  80% stenosis of proximal right ICA, 30% stenosis of proximal left ICA -LDL 133, placed on Crestor 20 mg daily, not on statins prior to admission -BP soft this a.m., placed on IV fluid hydration, hold antihypertensives -Continue serial neurochecks, PT OT -Hemoglobin A1c 12/27/2020 was 8.9 - stroke team to follow  Diabetes mellitus type 2, NIDDM, uncontrolled with hyperglycemia, not on long-term insulin -Hemoglobin A1c 8.9, hold Metformin, semaglutide -Continue sliding scale insulin while inpatient  Lynwood Kubisiak M.D.  [facility] 01/29/2021, 10:22 AM   Addendum 2: 30PM Called by Dr. Pearlean Brownie, given embolic stroke, requested loop recorder to be placed.  I have consulted cardiology.     Thad Ranger M.D.  [facility] 01/29/2021, 2:37 PM

## 2021-01-29 NOTE — Progress Notes (Signed)
  Echocardiogram 2D Echocardiogram has been performed.  Augustine Radar 01/29/2021, 3:38 PM

## 2021-01-29 NOTE — Progress Notes (Addendum)
STROKE TEAM PROGRESS NOTE   INTERVAL HISTORY Patient is evaluated at the bedside.  No family present in the room.Vitals stable. Pt is Afebrile.  Patient states he was reading his email yesterday and he noticed some blurring of vision on right side.  Patient has a history of previous stroke in 2005 when he was in [place] but not taking any Aspirin, Plavix or statin.  He smokes 6 to 7 cigars every day.  Advised him to stop smoking.  On neuro examination, he is alert, oriented x4.  Speech is little slurred and he has left facial droop(from last stroke). Visual fields are full.  Head CT showed suspected subacute left parietal occipital  infarct.  CT angio head and neck shows Moderate stenosis of the communicating segment of the distal left internal carotid artery with 80% stenosis of the proximal right internal carotid artery secondary to mixed density arthrosclerosis and 30% stenosis of the proximal left internal carotid artery.  Vascular US carotid duplex done today, report pending.  LDL 133, A1c 8.9.  U tox not done yet  we'll order, MRI brain pending, ECHO pending.  Pt is on Asprin 81 mg and Plavix 75 mg. Vitals:   01/29/21 0600 01/29/21 0635 01/29/21 0830 01/29/21 1019  BP:  (!) 113/55  (!) 143/78  Pulse:    66  Resp:  19  20  Temp:  98.1 F (36.7 C) 97.9 F (36.6 C) 98.2 F (36.8 C)  TempSrc:  Oral Axillary Oral  SpO2:  95%    Weight: 85.9 kg      CBC:  Recent Labs  Lab 01/28/21 2218 01/29/21 0057  WBC 10.3 8.8  NEUTROABS 6.7 5.6  HGB 16.4 16.0  HCT 46.9 43.7  MCV 91.4 88.3  PLT 173 177   Basic Metabolic Panel:  Recent Labs  Lab 01/28/21 2218 01/29/21 0057  NA 134* 135  K 3.6 3.5  CL 101 104  CO2 23 20*  GLUCOSE 220* 162*  BUN 15 12  CREATININE 0.87 0.71  CALCIUM 9.4 9.3   Lipid Panel:  Recent Labs  Lab 01/29/21 0057  CHOL 199  TRIG 112  HDL 44  CHOLHDL 4.5  VLDL 22  LDLCALC 829*   HgbA1c: No results for input(s): HGBA1C in the last 168 hours. Urine Drug  Screen: No results for input(s): LABOPIA, COCAINSCRNUR, LABBENZ, AMPHETMU, THCU, LABBARB in the last 168 hours.  Alcohol Level No results for input(s): ETH in the last 168 hours.  IMAGING past 24 hours CT ANGIO HEAD W OR WO CONTRAST  Result Date: 01/29/2021 CLINICAL DATA:  TIA.  Search for embolic source. EXAM: CT ANGIOGRAPHY HEAD AND NECK TECHNIQUE: Multidetector CT imaging of the head and neck was performed using the standard protocol during bolus administration of intravenous contrast. Multiplanar CT image reconstructions and MIPs were obtained to evaluate the vascular anatomy. Carotid stenosis measurements (when applicable) are obtained utilizing NASCET criteria, using the distal internal carotid diameter as the denominator. CONTRAST:  63mL OMNIPAQUE IOHEXOL 350 MG/ML SOLN COMPARISON:  None. FINDINGS: CTA NECK FINDINGS SKELETON: There is no bony spinal canal stenosis. No lytic or blastic lesion. OTHER NECK: Normal pharynx, larynx and major salivary glands. No cervical lymphadenopathy. Unremarkable thyroid gland. UPPER CHEST: No pneumothorax or pleural effusion. No nodules or masses. AORTIC ARCH: There is no calcific atherosclerosis of the aortic arch. There is no aneurysm, dissection or hemodynamically significant stenosis of the visualized portion of the aorta. Conventional 3 vessel aortic branching pattern. The visualized proximal subclavian arteries  are widely patent. RIGHT CAROTID SYSTEM: No dissection, occlusion or aneurysm. There is mixed density atherosclerosis extending into the proximal ICA, resulting in 80% stenosis. LEFT CAROTID SYSTEM: No dissection, occlusion or aneurysm. There is predominantly calcified atherosclerosis extending into the proximal ICA, resulting in 30% stenosis. VERTEBRAL ARTERIES: Left dominant configuration. Both origins are clearly patent. There is no dissection, occlusion or flow-limiting stenosis to the skull base (V1-V3 segments). CTA HEAD FINDINGS POSTERIOR CIRCULATION:  --Vertebral arteries: Normal V4 segments. --Inferior cerebellar arteries: Normal. --Basilar artery: Normal. --Superior cerebellar arteries: Normal. --Posterior cerebral arteries (PCA): Normal. ANTERIOR CIRCULATION: --Intracranial internal carotid arteries: Moderate stenosis of the communicating segment of the distal left ICA. Bilateral atherosclerotic calcification. --Anterior cerebral arteries (ACA): Normal. Both A1 segments are present. Patent anterior communicating artery (a-comm). --Middle cerebral arteries (MCA): Normal. VENOUS SINUSES: As permitted by contrast timing, patent. ANATOMIC VARIANTS: Fetal origins of both posterior cerebral arteries. Review of the MIP images confirms the above findings. IMPRESSION: 1. No intracranial arterial occlusion. 2. Moderate stenosis of the communicating segment of the distal left internal carotid artery. 3. 80% stenosis of the proximal right internal carotid artery secondary to mixed density atherosclerosis. 4. 30% stenosis of the proximal left internal carotid artery secondary to predominantly calcified atherosclerosis. Electronically Signed   By: Deatra Robinson M.D.   On: 01/29/2021 01:51   CT Head Wo Contrast  Result Date: 01/28/2021 CLINICAL DATA:  Visual deficit for 10 hours EXAM: CT HEAD WITHOUT CONTRAST TECHNIQUE: Contiguous axial images were obtained from the base of the skull through the vertex without intravenous contrast. COMPARISON:  None. FINDINGS: Brain: Hypodensity within the left occipital cortex and subcortical white matter consistent with acute or subacute infarct. Chronic ischemic changes are seen within the right basal ganglia. No acute hemorrhage. Lateral ventricles and remaining midline structures are unremarkable. No acute extra-axial fluid collections. No mass effect. Vascular: No hyperdense vessel or unexpected calcification. Skull: Normal. Negative for fracture or focal lesion. Sinuses/Orbits: No acute finding. Other: None. IMPRESSION: 1. Acute  to subacute left occipital cortical infarct. 2. No acute hemorrhage. Electronically Signed   By: Sharlet Salina M.D.   On: 01/28/2021 22:28   CT ANGIO NECK W OR WO CONTRAST  Result Date: 01/29/2021 CLINICAL DATA:  TIA.  Search for embolic source. EXAM: CT ANGIOGRAPHY HEAD AND NECK TECHNIQUE: Multidetector CT imaging of the head and neck was performed using the standard protocol during bolus administration of intravenous contrast. Multiplanar CT image reconstructions and MIPs were obtained to evaluate the vascular anatomy. Carotid stenosis measurements (when applicable) are obtained utilizing NASCET criteria, using the distal internal carotid diameter as the denominator. CONTRAST:  99mL OMNIPAQUE IOHEXOL 350 MG/ML SOLN COMPARISON:  None. FINDINGS: CTA NECK FINDINGS SKELETON: There is no bony spinal canal stenosis. No lytic or blastic lesion. OTHER NECK: Normal pharynx, larynx and major salivary glands. No cervical lymphadenopathy. Unremarkable thyroid gland. UPPER CHEST: No pneumothorax or pleural effusion. No nodules or masses. AORTIC ARCH: There is no calcific atherosclerosis of the aortic arch. There is no aneurysm, dissection or hemodynamically significant stenosis of the visualized portion of the aorta. Conventional 3 vessel aortic branching pattern. The visualized proximal subclavian arteries are widely patent. RIGHT CAROTID SYSTEM: No dissection, occlusion or aneurysm. There is mixed density atherosclerosis extending into the proximal ICA, resulting in 80% stenosis. LEFT CAROTID SYSTEM: No dissection, occlusion or aneurysm. There is predominantly calcified atherosclerosis extending into the proximal ICA, resulting in 30% stenosis. VERTEBRAL ARTERIES: Left dominant configuration. Both origins are clearly patent. There is  no dissection, occlusion or flow-limiting stenosis to the skull base (V1-V3 segments). CTA HEAD FINDINGS POSTERIOR CIRCULATION: --Vertebral arteries: Normal V4 segments. --Inferior  cerebellar arteries: Normal. --Basilar artery: Normal. --Superior cerebellar arteries: Normal. --Posterior cerebral arteries (PCA): Normal. ANTERIOR CIRCULATION: --Intracranial internal carotid arteries: Moderate stenosis of the communicating segment of the distal left ICA. Bilateral atherosclerotic calcification. --Anterior cerebral arteries (ACA): Normal. Both A1 segments are present. Patent anterior communicating artery (a-comm). --Middle cerebral arteries (MCA): Normal. VENOUS SINUSES: As permitted by contrast timing, patent. ANATOMIC VARIANTS: Fetal origins of both posterior cerebral arteries. Review of the MIP images confirms the above findings. IMPRESSION: 1. No intracranial arterial occlusion. 2. Moderate stenosis of the communicating segment of the distal left internal carotid artery. 3. 80% stenosis of the proximal right internal carotid artery secondary to mixed density atherosclerosis. 4. 30% stenosis of the proximal left internal carotid artery secondary to predominantly calcified atherosclerosis. Electronically Signed   By: Deatra Robinson M.D.   On: 01/29/2021 01:51    PHYSICAL EXAM GENERAL: Awake, alert in NAD HEENT: - Normocephalic and atraumatic, dry mucous membranes LUNGS - Symmetrical chest rise, No labored breathing noted CV - no JVD, No Peripheral Edema ABDOMEN - Soft,  nondistended  Ext: warm, well perfused, intact peripheral pulses, no Peripheral edema  NEURO Exam:   Mental Status: AA&Ox4. Oriented to self, age, place, situation. Good Attention and Concentration Language: speech is slurred.  Pt is able to name simple objects, repetition intact,  fluency, and comprehension intact. Cranial Nerves:   CN II Pupils equal and reactive to light, VF full   CN III,IV,VI EOM intact, no gaze preference or deviation, no nystagmus    CN V normal sensation in V1, V2, and V3 segments bilaterally   CN VII Lt facial droop, no nasolabial fold flattening   CN VIII normal hearing to speech    CN IX & X normal palatal elevation, no uvular deviation    CN XI 5/5 head turn and 5/5 shoulder shrug bilaterally    CN XII midline tongue protrusion    Motor: No Drift in Upper Extremities, No Drift in LE's. Strength 5/5 in Rt UE, 5/5 in Lt UE, Strength in Rt LE  5/5, Lt LE 5/5 Planters- Down going Tone: is normal and bulk is normal Sensation- Intact to light touch bilaterally Coordination: FTN intact bilaterally, no ataxia. Gait- deferred   ASSESSMENT/PLAN William Fitzpatrick is a 65 y.o. male with history of  previous stroke who presents with visual change. CTHead was performed which demonstrates a subacute appearing stroke in the left occipital lobe. CT angio head and neck shows Moderate stenosis of the communicating segment of the distal left internal carotid artery with 80% stenosis of the proximal right internal carotid artery secondary to mixed density arthrosclerosis and 30% stenosis of the proximal left internal carotid artery.  tPA was not given as patient was out of time window.   LDL 133, HbA1c 8.9.  U tox not done yet  we'll order, Pt was given loading dose of Aspirin 324 mg and Plavix 300 mg and then started on Asprin 81 mg and Plavix 75 mg. Pt reccomended no Pt follow up, intermittent supervision. OT recs Outpatient OT. MRI brain pending,  Vascular US carotid duplex done today, report pending. ECHO pending.   Stroke- Subacute appearing ischemic stroke in the left occipital lobe as well as small left posterior frontal, left basal ganglia, right frontal and left cerebellar embolic infarcts likely from central cardiac source.  Code Stroke -subacute  appearing stroke in the left, lobe. No acute hemorrhage.  CTA head & neck - Moderate stenosis of the communicating segment of the distal left internal carotid artery with 80% stenosis of the proximal right internal carotid artery secondary to mixed density arthrosclerosis and 30% stenosis of the proximal left internal carotid artery.    MRI subacute left parieto-occipital infarct with smaller left hippocampal, left cerebellar, left posterior frontal and right medial frontal infarcts.  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  Carotid Doppler 60 to 79% right ICA and 1-39% left ICA stenosis.    2D Echo pending  LDL 133  HgbA1c 8.9  VTE prophylaxis - SCD's    Diet   Diet heart healthy/carb modified Room service appropriate? Yes; Fluid consistency: Thin    No antithrombotic prior to admission, now on aspirin 81 mg daily and clopidogrel 75 mg daily. (Aspririn and Plavix for 3 weeks and then Plavix 75 mg alone)  Therapy recommendations:  pending  Disposition:  TBD  Hypertension  Home meds:  Norvasc 10 mg daily, Microzide12.5 mg daily  Stable . Permissive hypertension (OK if < 220/120) but gradually normalize in 5-7 days . Long-term BP goal normotensive  Hyperlipidemia  Home meds: None, LDL 133, goal < 70  Crestor 20 mg started inpatient  Continue statin at discharge  Diabetes type II Controlled  Home meds: Metformin 1000 mg 24-hour tablet twice daily, NovoLog insulin, semaglutide 3 mg daily  HgbA1c 8.9, goal < 7.0  CBGs Recent Labs    01/29/21 0232 01/29/21 0649  GLUCAP 160* 173*      Primary team starting SSI.  Nicotine dependence  Patient smokes 5 to 6 cigars every day.  Advised to stop smoking.  Can offer nicotine patch.  Hypothyroidism Home medication include Synthroid 75 MCG daily  Continue Synthroid 75 MCG daily  Other Stroke Risk Factors  Advanced Age > 65   ETOH use, alcohol level <10, advised to drink no more than 1-2 drink(s) a day  Substance abuse - UDS: Pending  Patient has Hx stroke 2005-has left facial droop from previous stroke.  Was not taking aspirin or statin  Other Active Problems Schizophrenia- Stable.Home medication include olanzapine 10 mg, continue olanzapine 10 mg daily at bedtime  Hospital day # 0 I have personally obtained  history,examined this patient, reviewed notes, independently viewed imaging studies, participated in medical decision making and plan of care.ROS completed by me personally and pertinent positives fully documented  I have made any additions or clarifications directly to the above note. Agree with note above.  Presented with transient visual disturbance per MRI scan shows subacute left PCA as well as tiny left MCA left cerebellar and right ACA punctate infarcts as well likely from central cardiac source.  Recommend aspirin Plavix for 3 weeks followed by Plavix alone and aggressive risk factor modification.  Patient counseled to quit smoking.  Check ongoing stroke work-up and may need prolonged cardiac monitoring with loop recorder at discharge for paroxysmal A. fib.  Greater than 50% time during this 35-minute visit was spent in counseling and coordination of care about embolic strokes and questions.  Discussed with Dr. Isidoro Donningai and EP nurse practitioner.  Stroke team will sign off.  Kindly call for questions.  Delia HeadyPramod Nirvan Laban, MD Medical Director [facility] Pager: [phone] 01/29/2021 2:24 PM    To contact Stroke Continuity provider, please refer to [email or web address]. After hours, contact General Neurology

## 2021-01-29 NOTE — Evaluation (Signed)
Physical Therapy Evaluation Patient Details Name: William Fitzpatrick MRN: [id number] DOB: November 11, 1956 Today's Date: 01/29/2021   History of Present Illness  Patient is a 65 y/o male who presents with vision changes. Head CT- acute to subacute infarct in left occipital lobe. PMH includes schizophernia and stroke.  Clinical Impression  Patient presents with visual deficits and impaired balance s/p above. Pt lives alone and reports being independent for ADLs/IADLs and driving PTA. Pt had just woken up prior to arrival. Initially groggy but this improved with movement. Tolerated ambulation in the room (due to airborne precautions) with mild unsteadiness and difficulty with turns with LOB but pt able to recover balance on own. Likely due to being in bed all day yesterday. Pt's balance likely to improve with increased activity and likely will not require any follow up pending improvement. Noted to have difficulty reading the right side of words/numbers on board/clock. Education on how visual changes can impact mobility/balance and navigating the environment. Will follow acutely to maximize independence and mobility prior to return home.    Follow Up Recommendations No PT follow up;Supervision - Intermittent    Equipment Recommendations  Other (comment) (likely nothing pending improvement)    Recommendations for Other Services       Precautions / Restrictions Precautions Precautions: Fall Restrictions Weight Bearing Restrictions: No      Mobility  Bed Mobility Overal bed mobility: Needs Assistance Bed Mobility: Supine to Sit     Supine to sit: Min assist;HOB elevated     General bed mobility comments: Requires assist to elevate trunk to get to EOB, use of momentum but unable to sit up without help. Use of rail.    Transfers Overall transfer level: Needs assistance Equipment used: None Transfers: Sit to/from Stand Sit to Stand: Min guard         General transfer comment: Min guard  for safety. Stood from [employer].  Ambulation/Gait Ambulation/Gait assistance: Min guard Gait Distance (Feet): 50 Feet Assistive device: None Gait Pattern/deviations: Step-through pattern;Decreased stride length;Staggering left;Staggering right   Gait velocity interpretation: <1.31 ft/sec, indicative of household ambulator General Gait Details: Slow, unsteady gait, LOB with turning. "I have been in bed all day and just woke up." Staggering but no overt LOB.  Stairs            Wheelchair Mobility    Modified Rankin (Stroke Patients Only) Modified Rankin (Stroke Patients Only) Pre-Morbid Rankin Score: Slight disability Modified Rankin: Moderate disability     Balance Overall balance assessment: Needs assistance Sitting-balance support: Feet supported;No upper extremity supported Sitting balance-Leahy Scale: Good     Standing balance support: During functional activity Standing balance-Leahy Scale: Fair Standing balance comment: Able to stand without support but demonstrates some balance deficits during dynamic tasks but able to recover.                             Pertinent Vitals/Pain Pain Assessment: No/denies pain    Home Living Family/patient expects to be discharged to:: Private residence Living Arrangements: Alone   Type of Home: Apartment Home Access: Level entry     Home Layout: One level Home Equipment: None      Prior Function Level of Independence: Independent         Comments: Retired. Drives. Does IADLs/ADLs.     Hand Dominance   Dominant Hand: Right    Extremity/Trunk Assessment   Upper Extremity Assessment Upper Extremity Assessment: Defer to OT evaluation  Lower Extremity Assessment Lower Extremity Assessment: Overall WFL for tasks assessed    Cervical / Trunk Assessment Cervical / Trunk Assessment: Normal  Communication   Communication: No difficulties  Cognition Arousal/Alertness: Awake/alert Behavior During  Therapy: WFL for tasks assessed/performed Overall Cognitive Status: Within Functional Limits for tasks assessed                                 General Comments: for basic mobility tasks. Pt just woke up some mildly groggy, "Can i get my dinner please?" referring to breakfast sitting on the sink.  Aware of visual deficits.      General Comments General comments (skin integrity, edema, etc.): VSS on RA. Wears glasses at baseline. Reports difficulty seeing out of right visual field when reading things, having to adjust eyes to catch right side of word.    Exercises     Assessment/Plan    PT Assessment Patient needs continued PT services  PT Problem List Decreased mobility;Decreased safety awareness;Decreased balance;Decreased cognition       PT Treatment Interventions Therapeutic exercise;Gait training;Balance training;Neuromuscular re-education;Therapeutic activities;Patient/family education;Functional mobility training;Cognitive remediation    PT Goals (Current goals can be found in the Care Plan section)  Acute Rehab PT Goals Patient Stated Goal: to get better and go home PT Goal Formulation: With patient Time For Goal Achievement: 02/12/21 Potential to Achieve Goals: Good    Frequency Min 4X/week   Barriers to discharge Decreased caregiver support lives alone    Co-evaluation               AM-PAC PT "6 Clicks" Mobility  Outcome Measure Help needed turning from your back to your side while in a flat bed without using bedrails?: A Little Help needed moving from lying on your back to sitting on the side of a flat bed without using bedrails?: A Little Help needed moving to and from a bed to a chair (including a wheelchair)?: A Little Help needed standing up from a chair using your arms (e.g., wheelchair or bedside chair)?: A Little Help needed to walk in hospital room?: A Little Help needed climbing 3-5 steps with a railing? : A Little 6 Click Score:  18    End of Session   Activity Tolerance: Patient tolerated treatment well Patient left: in bed;with call bell/phone within reach;with bed alarm set (sitting EOB with MD in room) Nurse Communication: Mobility status PT Visit Diagnosis: Unsteadiness on feet (R26.81);Other abnormalities of gait and mobility (R26.89)    Time: 7654-6503 PT Time Calculation (min) (ACUTE ONLY): 21 min   Charges:   PT Evaluation $PT Eval Low Complexity: 1 Low          Vale Haven, PT, DPT Acute Rehabilitation Services Pager [phone] Office [phone]      Blake Divine A Lanier Ensign 01/29/2021, 8:22 AM

## 2021-01-29 NOTE — Consult Note (Signed)
Neurology Consultation Reason for Consult: Stroke Referring Physician: Radene Knee  CC: Vision change  History is obtained from: Patient  HPI: William Fitzpatrick is a 65 y.o. male with a history of previous stroke who presents with visual change that he noticed about 10 AM.  He was reading an email on his phone noticed that he was having trouble with reading parts of the words, specifically the right side.  He finally presented to the emergency department around 730 or 8 PM and after evaluation head CT was performed which demonstrates a subacute appearing stroke in the left occipital lobe   LKW: 10 AM tpa given?: no, out of window    ROS: A 14 point ROS was performed and is negative except as noted in the HPI.    Past Medical History:  Diagnosis Date  . Schizophrenia (HCC)   . Stroke [facility])      Family History  Problem Relation Age of Onset  . Diabetes Neg Hx   . Stroke Neg Hx      Social History:  reports that he has been smoking cigarettes. He has never used smokeless tobacco. He reports current alcohol use. He reports previous drug use.   Exam: Current vital signs: BP (!) 154/87 (BP Location: Right Arm)   Pulse 62   Temp 98.2 F (36.8 C) (Oral)   Resp 20   SpO2 100%  Vital signs in last 24 hours: Temp:  [98.2 F (36.8 C)-98.3 F (36.8 C)] 98.2 F (36.8 C) (02/15 0230) Pulse Rate:  [62-79] 62 (02/15 0230) Resp:  [16-20] 20 (02/15 0230) BP: (152-154)/(84-87) 154/87 (02/15 0230) SpO2:  [95 %-100 %] 100 % (02/15 0230)   Physical Exam  Constitutional: Appears well-developed and well-nourished.  Psych: Affect appropriate to situation Eyes: No scleral injection HENT: No OP obstruction MSK: no joint deformities.  Cardiovascular: Normal rate and regular rhythm.  Respiratory: Effort normal, non-labored breathing GI: Soft.  No distension. There is no tenderness.  Skin: WDI  Neuro: Mental Status: Patient is awake, alert, oriented to person, place, month, year,  and situation. Patient is able to give a clear and coherent history. No signs of aphasia or neglect Cranial Nerves: II: Visual Fields are full. Pupils are equal, round, and reactive to light.   III,IV, VI: EOMI without ptosis or diploplia.  V: Facial sensation is symmetric to temperature VII: Facial movement is symmetric.  VIII: hearing is intact to voice X: Uvula elevates symmetrically XI: Shoulder shrug is symmetric. XII: tongue is midline without atrophy or fasciculations.  Motor: Tone is normal. Bulk is normal. 5/5 strength was present in all four extremities.  Sensory: Sensation is symmetric to light touch and temperature in the arms and legs. Deep Tendon Reflexes: 2+ and symmetric in the biceps and patellae.  Plantars: Toes are downgoing bilaterally.  Cerebellar: FNF and HKS are intact bilaterally      I have reviewed labs in epic and the results pertinent to this consultation are: BMP with elevated glucose   I have reviewed the images obtained: CT head-unremarkable, CTA-no significant posterior circulation disease  Impression: 65 year old male with acute ischemic stroke in the left occipital region.  He was outside the window for IV TPA, has no large vessel occlusion amenable to thrombectomy.  Focus will need to be on secondary prevention, and with his glucose greater than 200, I suspect that diabetes management is going to play a role.  With his low NIH score, I would favor starting dual antiplatelet therapy.  An  MRI would be helpful to determine if there is an embolic pattern.  Recommendations: - HgbA1c, fasting lipid panel - MRI  of the brain without contrast - Frequent neuro checks - Echocardiogram - Prophylactic therapy-aspirin 81 mg daily and Plavix 75 mg daily after 300 mg load - Risk factor modification - Telemetry monitoring - PT consult, OT consult, Speech consult - Stroke team to follow    Ritta Slot, MD [facility] [phone]  If 7pm- 7am, please page neurology on call as listed in AMION.

## 2021-01-29 NOTE — ED Notes (Signed)
[facility] notified for pt transfer 

## 2021-01-29 NOTE — H&P (Signed)
History and Physical    William Fitzpatrick OIN:[id number] DOB: 01-19-1956 DOA: 01/28/2021  [facility]  Patient coming from: Home   Chief Complaint:  Chief Complaint  Patient presents with  . Eye Problem     HPI:    65 year old male with past medical history of diabetes mellitus type 2, nicotine dependence, schizophrenia, hypertension, hypothyroidism who [facility] long hospital emergency department with sudden onset of visual changes.  Patient explains that at approximately 10 AM this morning he was reading emails while sitting at his computer.  As he was reading his emails he suddenly noticed that he was unable to see anything in the central field of vision of both of his eyes.  Patient denies any associated slurring of speech, loss of balance, facial droop, focal weakness or severe headaches occurring with the onset of his symptoms.  Patient symptoms continued to persist throughout the day without any change.   Patient eventually contacted the office of one of his outpatient providers in the mid afternoon and upon discussing his symptoms with the clinic nurse was told to immediately call EMS to come to the emergency department for evaluation of a possible stroke.  Patient called EMS who promptly responded and arrived to the patient's home.  They brought the patient to [facility] emergency department for evaluation.  Upon evaluation in the emergency department CT imaging of the head without contrast revealed a acute to subacute left occipital cortical infarct.  The ER provider discussed the case with Dr. Amada Jupiter with neurology who recommended transferring the patient to [facility] for neurology consultation.  The hospitalist group was then called to assess the patient for mission to the hospital.  Review of Systems:   Review of Systems  Eyes:       Central visual field loss  All other systems reviewed and are negative.   Past Medical History:   Diagnosis Date  . Schizophrenia (HCC)   . Stroke [facility])     History reviewed. No pertinent surgical history.   reports that he has been smoking cigarettes. He has never used smokeless tobacco. He reports current alcohol use. He reports previous drug use.  No Known Allergies  Family History  Problem Relation Age of Onset  . Diabetes Neg Hx   . Stroke Neg Hx      Prior to Admission medications   Medication Sig Start Date End Date Taking? Authorizing Provider  amLODipine (NORVASC) 10 MG tablet Take 10 mg by mouth daily.   Yes [provider]  Apoaequorin (PREVAGEN PO) Take 1 capsule by mouth daily.   Yes [provider]  hydrochlorothiazide (MICROZIDE) 12.5 MG capsule Take 12.5 mg by mouth daily.   Yes [provider]  insulin aspart (NOVOLOG) 100 UNIT/ML injection For use in pump, total of 60 units per day. 12/19/20  Yes Romero Belling, MD  levothyroxine (SYNTHROID) 75 MCG tablet Take 75 mcg by mouth daily before breakfast.   Yes [provider]  metFORMIN (GLUCOPHAGE-XR) 500 MG 24 hr tablet Take 1,000 mg by mouth 2 (two) times daily. 08/14/20  Yes [provider]  OLANZapine (ZYPREXA) 20 MG tablet Take 10 mg by mouth at bedtime.   Yes [provider]  OVER THE COUNTER MEDICATION Take 1 tablet by mouth in the morning and at bedtime.   Yes [provider]  Continuous Blood Gluc Sensor (DEXCOM G6 SENSOR) MISC 1 Device by Does not apply route See admin instructions. 11/12/20   Romero Belling, MD  Semaglutide (RYBELSUS) 3 MG TABS Take 3 mg by mouth daily. 12/27/20   Romero Belling, MD    Physical Exam: Vitals:   01/28/21 2019  BP: (!) 152/84  Pulse: 79  Resp: 16  Temp: 98.3 F (36.8 C)  TempSrc: Oral  SpO2: 95%     Constitutional: Acute alert and oriented x3, no associated distress.   Skin: no rashes, no lesions, good skin turgor noted. Eyes: Pupils are equally reactive to light.  No evidence of scleral icterus or  conjunctival pallor.  ENMT: Moist mucous membranes noted.  Posterior pharynx clear of any exudate or lesions.   Neck: normal, supple, no masses, no thyromegaly.  No evidence of jugular venous distension.   Respiratory: clear to auscultation bilaterally, no wheezing, no crackles. Normal respiratory effort. No accessory muscle use.  Cardiovascular: Regular rate and rhythm, no murmurs / rubs / gallops. No extremity edema. 2+ pedal pulses. No carotid bruits.  Chest:   Nontender without crepitus or deformity.   Back:   Nontender without crepitus or deformity. Abdomen: Abdomen is soft and nontender.  No evidence of intra-abdominal masses.  Positive bowel sounds noted in all quadrants.   Musculoskeletal: No joint deformity upper and lower extremities. Good ROM, no contractures. Normal muscle tone.  Neurologic: Patient reports central visual field loss bilaterally.  I have been unable to reproduce this finding upon assessment of patient's visual fields however. CN 2-12 grossly intact. Sensation intact.  Patient moving all 4 extremities spontaneously.  Patient is following all commands.  Patient is responsive to verbal stimuli.   Psychiatric: Patient exhibits normal mood with appropriate affect.  Patient seems to possess insight as to their current situation.     Labs on Admission: I have personally reviewed following labs and imaging studies -   CBC: Recent Labs  Lab 01/28/21 2218  WBC 10.3  NEUTROABS 6.7  HGB 16.4  HCT 46.9  MCV 91.4  PLT 173   Basic Metabolic Panel: Recent Labs  Lab 01/28/21 2218  NA 134*  K 3.6  CL 101  CO2 23  GLUCOSE 220*  BUN 15  CREATININE 0.87  CALCIUM 9.4   GFR: CrCl cannot be calculated (Unknown ideal weight.). Liver Function Tests: No results for input(s): AST, ALT, ALKPHOS, BILITOT, PROT, ALBUMIN in the last 168 hours. No results for input(s): LIPASE, AMYLASE in the last 168 hours. No results for input(s): AMMONIA in the last 168 hours. Coagulation  Profile: Recent Labs  Lab 01/28/21 2253  INR 1.0   Cardiac Enzymes: No results for input(s): CKTOTAL, CKMB, CKMBINDEX, TROPONINI in the last 168 hours. BNP (last 3 results) No results for input(s): PROBNP in the last 8760 hours. HbA1C: No results for input(s): HGBA1C in the last 72 hours. CBG: No results for input(s): GLUCAP in the last 168 hours. Lipid Profile: No results for input(s): CHOL, HDL, LDLCALC, TRIG, CHOLHDL, LDLDIRECT in the last 72 hours. Thyroid Function Tests: No results for input(s): TSH, T4TOTAL, FREET4, T3FREE, THYROIDAB in the last 72 hours. Anemia Panel: No results for input(s): VITAMINB12, FOLATE, FERRITIN, TIBC, IRON, RETICCTPCT in the last 72 hours. Urine analysis: No results found for: COLORURINE, APPEARANCEUR, LABSPEC, PHURINE, GLUCOSEU, HGBUR, BILIRUBINUR, KETONESUR, PROTEINUR, UROBILINOGEN, NITRITE, LEUKOCYTESUR  Radiological Exams on Admission - Personally Reviewed: CT Head Wo Contrast  Result Date: 01/28/2021 CLINICAL DATA:  Visual deficit for 10 hours EXAM: CT HEAD WITHOUT CONTRAST TECHNIQUE: Contiguous axial images were obtained from the base of the skull through the vertex without intravenous contrast. COMPARISON:  None.  FINDINGS: Brain: Hypodensity within the left occipital cortex and subcortical white matter consistent with acute or subacute infarct. Chronic ischemic changes are seen within the right basal ganglia. No acute hemorrhage. Lateral ventricles and remaining midline structures are unremarkable. No acute extra-axial fluid collections. No mass effect. Vascular: No hyperdense vessel or unexpected calcification. Skull: Normal. Negative for fracture or focal lesion. Sinuses/Orbits: No acute finding. Other: None. IMPRESSION: 1. Acute to subacute left occipital cortical infarct. 2. No acute hemorrhage. Electronically Signed   By: Sharlet Salina M.D.   On: 01/28/2021 22:28    EKG: Personally reviewed.  Rhythm is normal sinus rhythm with heart rate of  66 bpm.  No dynamic ST segment changes appreciated.  Assessment/Plan Principal Problem:   Acute cerebrovascular accident (CVA) due to occlusion of left posterior cerebral artery [facility])   ER staff have described a hemianonymous hemianopsia however this is does not seem to quite be the case.  Patient complaining of bilateral central visual field loss.  Patient is presented outside of the TPA window  CT imaging of the head revealing a acute to subacute left occipital cortical infarct  ER provider did discuss the case with Dr. Amada Jupiter with neurology who recommended transferring the patient to [facility] for stroke team evaluation/consultation.  Initiating 325 mg of aspirin now followed by 81 mg by mouth daily.  Obtaining lipid panel and initiating high intensity statin therapy if LDL is above 70.  Neurologic checks every 4 hours  Monitoring patient on telemetry  PT, OT, SLP evaluation  Echocardiogram in the morning  CTA of the head neck  Permissive hypertension  Active Problems:   Schizophrenia (HCC)   Continuing home regimen of psychotropic agents    Type 2 diabetes mellitus with hyperglycemia, with long-term current use of insulin [facility])   Patient managing his blood sugars with his own insulin pump  Monitoring and guiding patient to ensure adequate glycemic control.    Nicotine dependence, other tobacco product, uncomplicated   Counseling patient daily on cessation    Essential hypertension    Permissive hypertension in light of acute stroke  Only providing as needed intravenous antihypertensives for markedly elevated blood pressures.   Code Status:  Full code Family Communication: deferred   Status is: Observation  The patient remains OBS appropriate and will d/c before 2 midnights.  Dispo: The patient is from: Home              Anticipated d/c is to: Home              Anticipated d/c date is: 2 days              Patient currently is not  medically stable to d/c.   Difficult to place patient No        Marinda Elk MD [facility] Pager [phone]  If 7PM-7AM, please contact night-coverage [email or web address] Use universal [place] password for that web site. If you do not have the password, please call the hospital operator.  01/29/2021, 12:46 AM

## 2021-01-29 NOTE — Progress Notes (Signed)
Inpatient Diabetes Program Recommendations  AACE/ADA: New Consensus Statement on Inpatient Glycemic Control (2015)  Target Ranges:  Prepandial:   less than 140 mg/dL      Peak postprandial:   less than 180 mg/dL (1-2 hours)      Critically ill patients:  140 - 180 mg/dL   Results for William Fitzpatrick, William Fitzpatrick (MRN [id number]) as of 01/29/2021 14:06  Ref. Range 01/29/2021 02:32 01/29/2021 06:49 01/29/2021 11:40  Glucose-Capillary Latest Ref Range: 70 - 99 mg/dL 382 (H) 505 (H) 397 (H)   Results for William Fitzpatrick, William Fitzpatrick (MRN [id number]) as of 01/29/2021 14:06  Ref. Range 10/26/2020 09:10 12/27/2020 15:13  Hemoglobin A1C Latest Ref Range: 4.0 - 5.6 % 10.2 (A) 8.9 (A)    Admit with: CVA  History: Diabetes  Home DM Meds: Insulin Pump         Rybelsus 3 mg Daily       Metformin 1000 mg BID  Current Orders: CBG checks tid ac + hs    MD- If patient currently allowed to use his insulin pump in the hospital, will need Insulin Pump order set placed   Addendum 6pm--Spoke w/ Dr. Isidoro Donning by phone this evening.  Pt removed insulin pump for MRI and does not have replacement insulin pump supplies.  Will need Lantus, Novolog SSI, and Novolog meal Coverage while off home insulin pump.  Discussed with Dr. Isidoro Donning.  Orders placed to start Levemir 15 units QHS tonight along with Novolog 0-15 units SSI + Novolog 4 units meal Coverage.  These orders are very similar to the amounts of Insulin that pt gets with his insulin pump.   Endocrinologist: Dr. Zettie Pho seen 12/27/2020--was told to continue his Current Insulin Pump settings (see below) and Dr. Everardo All added Rybelsus 3 mg Daily at that visit Pump Settings as of 12/27/2020: Basal Rate= 0.6 unit/hr Total Basal per 24 hour period= 14.4 units Carbohydrate Ratio= 1 unit for every 12 grams Carbohydrates Correction Factor= 1 unit for every 50 mg/dl aboveTarget CBG Target CBG= 100 mg/dl   --Will follow patient during hospitalization--  Ambrose Finland RN,  MSN, CDE Diabetes Coordinator Inpatient Glycemic Control Team Team Pager: [phone] (8a-5p)

## 2021-01-29 NOTE — ED Notes (Signed)
Report given to [facility]. 

## 2021-01-30 ENCOUNTER — Encounter (HOSPITAL_COMMUNITY): Payer: Self-pay | Admitting: Internal Medicine

## 2021-01-30 ENCOUNTER — Other Ambulatory Visit: Payer: Self-pay | Admitting: Internal Medicine

## 2021-01-30 ENCOUNTER — Encounter (HOSPITAL_COMMUNITY)
Admission: EM | Disposition: A | Discharge status: Home / Self Care | Payer: Self-pay | Source: Home / Self Care | Attending: Internal Medicine

## 2021-01-30 DIAGNOSIS — F209 Schizophrenia, unspecified: Secondary | ICD-10-CM | POA: Diagnosis not present

## 2021-01-30 DIAGNOSIS — E039 Hypothyroidism, unspecified: Secondary | ICD-10-CM | POA: Diagnosis not present

## 2021-01-30 DIAGNOSIS — F17290 Nicotine dependence, other tobacco product, uncomplicated: Secondary | ICD-10-CM

## 2021-01-30 DIAGNOSIS — I10 Essential (primary) hypertension: Secondary | ICD-10-CM | POA: Diagnosis not present

## 2021-01-30 DIAGNOSIS — E1165 Type 2 diabetes mellitus with hyperglycemia: Secondary | ICD-10-CM

## 2021-01-30 DIAGNOSIS — Z794 Long term (current) use of insulin: Secondary | ICD-10-CM

## 2021-01-30 DIAGNOSIS — I639 Cerebral infarction, unspecified: Secondary | ICD-10-CM

## 2021-01-30 HISTORY — PX: LOOP RECORDER INSERTION: EP1214

## 2021-01-30 LAB — GLUCOSE, CAPILLARY
Glucose-Capillary: 158 mg/dL — ABNORMAL HIGH (ref 70–99)
Glucose-Capillary: 127 mg/dL — ABNORMAL HIGH (ref 70–99)
Glucose-Capillary: 178 mg/dL — ABNORMAL HIGH (ref 70–99)

## 2021-01-30 SURGERY — LOOP RECORDER INSERTION

## 2021-01-30 MED ORDER — ASPIRIN 81 MG PO TBEC: 81.0000 mg | DELAYED_RELEASE_TABLET | Freq: Every day | ORAL | 11 refills | Status: DC

## 2021-01-30 MED ORDER — ROSUVASTATIN CALCIUM 20 MG PO TABS: 20.0000 mg | ORAL_TABLET | Freq: Every day | ORAL | 0 refills | Status: DC

## 2021-01-30 MED ORDER — LIDOCAINE-EPINEPHRINE 1 %-1:100000 IJ SOLN
INTRAMUSCULAR | Status: DC | PRN
Start: 2021-01-30 — End: 2021-01-30
  Administered 2021-01-30: 10 mL

## 2021-01-30 MED ORDER — LIDOCAINE-EPINEPHRINE 1 %-1:100000 IJ SOLN
INTRAMUSCULAR | Status: AC
  Filled 2021-01-30: qty 1

## 2021-01-30 MED ORDER — HYDROCHLOROTHIAZIDE 12.5 MG PO CAPS: 12.5000 mg | ORAL_CAPSULE | Freq: Every day | ORAL | Status: AC

## 2021-01-30 MED ORDER — CLOPIDOGREL BISULFATE 75 MG PO TABS: 75.0000 mg | ORAL_TABLET | Freq: Every day | ORAL | 0 refills | Status: DC

## 2021-01-30 MED ORDER — NICOTINE 21 MG/24HR TD PT24: 21.0000 mg | MEDICATED_PATCH | Freq: Every day | TRANSDERMAL | 0 refills | Status: AC

## 2021-01-30 MED ORDER — AMLODIPINE BESYLATE 10 MG PO TABS: 10.0000 mg | ORAL_TABLET | Freq: Every day | ORAL | Status: DC

## 2021-01-30 MED ORDER — CLOPIDOGREL BISULFATE 75 MG PO TABS: 75.0000 mg | ORAL_TABLET | Freq: Every day | ORAL | 1 refills | Status: AC

## 2021-01-30 MED ORDER — ROSUVASTATIN CALCIUM 20 MG PO TABS: 20.0000 mg | ORAL_TABLET | Freq: Every day | ORAL | 0 refills | Status: AC

## 2021-01-30 MED ORDER — NICOTINE 21 MG/24HR TD PT24: 21.0000 mg | MEDICATED_PATCH | Freq: Every day | TRANSDERMAL | 0 refills | Status: DC

## 2021-01-30 MED FILL — ROSUVASTATIN CALCIUM 20 MG: 20 | 30 days supply | Qty: 30 | Fill #0

## 2021-01-30 MED FILL — CLOPIDOGREL 75 MG TABLET: 75 | 21 days supply | Qty: 21 | Fill #0

## 2021-01-30 SURGICAL SUPPLY — 2 items
MONITOR REVEAL LINQ II (Prosthesis & Implant Heart) ×2 IMPLANT
PACK LOOP INSERTION (CUSTOM PROCEDURE TRAY) ×2 IMPLANT

## 2021-01-30 NOTE — Consult Note (Addendum)
ELECTROPHYSIOLOGY CONSULT NOTE  Patient ID: William Fitzpatrick MRN: [id number], DOB/AGE: 67/05/1956   Admit date: 01/28/2021 Date of Consult: 01/30/2021  Primary Physician: Administration, [facility] Primary Cardiologist: none Reason for Consultation: Cryptogenic stroke; recommendations regarding Implantable Loop Recorder, requested by Dr. Pearlean BrownieSethi  History of Present Illness William Fitzpatrick was admitted on 01/28/2021 with sudden onset of visual changes, stroke, [facility].  PMHx includes: DM, prior stroke, HTN, hypothyroidism, schizophrenia  Neurology notes: .  he has undergone workup for stroke including echocardiogram and carotid dopplers.  The patient has been monitored on telemetry which has demonstrated sinus rhythm with no arrhythmias.    Neurology has deferred TEE  Echocardiogram this admission demonstrated   IMPRESSIONS  1. Left ventricular ejection fraction, by estimation, is 55 to 60%. The  left ventricle has normal function. The left ventricle has no regional  wall motion abnormalities. Left ventricular diastolic parameters are  consistent with Grade I diastolic  dysfunction (impaired relaxation).  2. Right ventricular systolic function is normal. The right ventricular  size is normal. Tricuspid regurgitation signal is inadequate for assessing  PA pressure.  3. The mitral valve is normal in structure. No evidence of mitral valve  regurgitation. No evidence of mitral stenosis.  4. The aortic valve is normal in structure. Aortic valve regurgitation is  not visualized. Mild aortic valve sclerosis is present, with no evidence  of aortic valve stenosis.  5. The inferior vena cava is normal in size with greater than 50%  respiratory variability, suggesting right atrial pressure of 3 mmHg.  6. Agitated saline contrast bubble study was negative, with no evidence  of any interatrial shunt.    Lab work is reviewed. COVID neg  Prior to  admission, the patient denies chest pain, shortness of breath, dizziness, palpitations, or syncope.  They are recovering from their stroke PT/OT completed, no PT follow up or supervision recommended, outpt OT for his visual loss. Patient is hopeful to go home today.    Past Medical History:  Diagnosis Date  . Schizophrenia (HCC)   . Stroke [facility](HCC)      Surgical History: History reviewed. No pertinent surgical history.   Medications Prior to Admission  Medication Sig Dispense Refill Last Dose  . amLODipine (NORVASC) 10 MG tablet Take 10 mg by mouth daily.   01/28/2021 at Unknown time  . Apoaequorin (PREVAGEN PO) Take 1 capsule by mouth daily.   01/28/2021 at Unknown time  . hydrochlorothiazide (MICROZIDE) 12.5 MG capsule Take 12.5 mg by mouth daily.   01/27/2021 at Unknown time  . insulin aspart (NOVOLOG) 100 UNIT/ML injection For use in pump, total of 60 units per day. 60 mL PRN 01/28/2021 at Unknown time  . levothyroxine (SYNTHROID) 75 MCG tablet Take 75 mcg by mouth daily before breakfast.   several months at Unknown time  . metFORMIN (GLUCOPHAGE-XR) 500 MG 24 hr tablet Take 1,000 mg by mouth 2 (two) times daily.   01/28/2021 at Unknown time  . OLANZapine (ZYPREXA) 20 MG tablet Take 10 mg by mouth at bedtime.   01/27/2021 at Unknown time  . OVER THE COUNTER MEDICATION Take 1 tablet by mouth in the morning and at bedtime.   01/28/2021 at Unknown time  . Continuous Blood Gluc Sensor (DEXCOM G6 SENSOR) MISC 1 Device by Does not apply route See admin instructions. 10 each 3   . Semaglutide (RYBELSUS) 3 MG TABS Take 3 mg by mouth daily. 90 tablet 3 not started  Inpatient Medications:  . aspirin EC  81 mg Oral Daily  . clopidogrel  75 mg Oral Daily  . insulin aspart  0-15 Units Subcutaneous TID WC  . insulin aspart  0-5 Units Subcutaneous QHS  . insulin aspart  4 Units Subcutaneous TID WC  . insulin detemir  15 Units Subcutaneous QHS  . levothyroxine  75 mcg Oral Q0600  . nicotine  21 mg  Transdermal Daily  . OLANZapine  10 mg Oral QHS  . rosuvastatin  20 mg Oral Daily    Allergies: No Known Allergies  Social History   Socioeconomic History  . Marital status: Divorced    Spouse name: Not on file  . Number of children: Not on file  . Years of education: Not on file  . Highest education level: Not on file  Occupational History  . Not on file  Tobacco Use  . Smoking status: Current Every Day Smoker    Types: Cigarettes  . Smokeless tobacco: Never Used  Vaping Use  . Vaping Use: Never used  Substance and Sexual Activity  . Alcohol use: Yes  . Drug use: Not Currently  . Sexual activity: Not on file  Other Topics Concern  . Not on file  Social History Narrative  . Not on file   Social Determinants of Health   Financial Resource Strain: Not on file  Food Insecurity: Not on file  Transportation Needs: Not on file  Physical Activity: Not on file  Stress: Not on file  Social Connections: Not on file  Intimate Partner Violence: Not on file     Family History  Problem Relation Age of Onset  . Diabetes Neg Hx   . Stroke Neg Hx       Review of Systems: All other systems reviewed and are otherwise negative except as noted above.  Physical Exam: Vitals:   01/30/21 0011 01/30/21 0400 01/30/21 0412 01/30/21 0515  BP: (!) 152/67 (!) 145/79 (!) 142/76   Pulse: (!) 59  (!) 56   Resp: 16 15 12 16   Temp: 98.2 F (36.8 C)  97.8 F (36.6 C)   TempSrc: Oral  Oral   SpO2: 93% 95% 98% 94%  Weight:        GEN- The patient is well appearing, alert and oriented x 3 today, a somewhat disheveled appearance Head- normocephalic, atraumatic Eyes-  Sclera clear, conjunctiva pink Ears- hearing intact Oropharynx- clear Neck- supple Lungs- CTA b/l, normal work of breathing Heart- RRR, no murmurs, rubs or gallops  GI- soft, NT, ND Extremities- no clubbing, cyanosis, or edema MS- no significant deformity or atrophy Skin- no rash or lesion Psych- euthymic mood,  full affect   Labs:   Lab Results  Component Value Date   WBC 8.8 01/29/2021   HGB 16.0 01/29/2021   HCT 43.7 01/29/2021   MCV 88.3 01/29/2021   PLT 177 01/29/2021    Recent Labs  Lab 01/29/21 0057  NA 135  K 3.5  CL 104  CO2 20*  BUN 12  CREATININE 0.71  CALCIUM 9.3  PROT 6.3*  BILITOT 0.6  ALKPHOS 91  ALT 18  AST 16  GLUCOSE 162*   No results found for: CKTOTAL, CKMB, CKMBINDEX, TROPONINI Lab Results  Component Value Date   CHOL 199 01/29/2021   Lab Results  Component Value Date   HDL 44 01/29/2021   Lab Results  Component Value Date   LDLCALC 133 (H) 01/29/2021   Lab Results  Component Value Date  TRIG 112 01/29/2021   Lab Results  Component Value Date   CHOLHDL 4.5 01/29/2021   No results found for: LDLDIRECT  No results found for: DDIMER   Radiology/Studies:  CT ANGIO HEAD W OR WO CONTRAST Result Date: 01/29/2021 CLINICAL DATA:  TIA.  Search for embolic source. EXAM: CT ANGIOGRAPHY HEAD AND NECK TECHNIQUE: Multidetector CT imaging of the head and neck was performed using the standard protocol during bolus administration of intravenous contrast. Multiplanar CT image reconstructions and MIPs were obtained to evaluate the vascular anatomy. Carotid stenosis measurements (when applicable) are obtained utilizing NASCET criteria, using the distal internal carotid diameter as the denominator. CONTRAST:  47mL OMNIPAQUE IOHEXOL 350 MG/ML SOLN COMPARISON:  None. FINDINGS: CTA NECK FINDINGS SKELETON: There is no bony spinal canal stenosis. No lytic or blastic lesion. OTHER NECK: Normal pharynx, larynx and major salivary glands. No cervical lymphadenopathy. Unremarkable thyroid gland. UPPER CHEST: No pneumothorax or pleural effusion. No nodules or masses. AORTIC ARCH: There is no calcific atherosclerosis of the aortic arch. There is no aneurysm, dissection or hemodynamically significant stenosis of the visualized portion of the aorta. Conventional 3 vessel aortic  branching pattern. The visualized proximal subclavian arteries are widely patent. RIGHT CAROTID SYSTEM: No dissection, occlusion or aneurysm. There is mixed density atherosclerosis extending into the proximal ICA, resulting in 80% stenosis. LEFT CAROTID SYSTEM: No dissection, occlusion or aneurysm. There is predominantly calcified atherosclerosis extending into the proximal ICA, resulting in 30% stenosis. VERTEBRAL ARTERIES: Left dominant configuration. Both origins are clearly patent. There is no dissection, occlusion or flow-limiting stenosis to the skull base (V1-V3 segments). CTA HEAD FINDINGS POSTERIOR CIRCULATION: --Vertebral arteries: Normal V4 segments. --Inferior cerebellar arteries: Normal. --Basilar artery: Normal. --Superior cerebellar arteries: Normal. --Posterior cerebral arteries (PCA): Normal. ANTERIOR CIRCULATION: --Intracranial internal carotid arteries: Moderate stenosis of the communicating segment of the distal left ICA. Bilateral atherosclerotic calcification. --Anterior cerebral arteries (ACA): Normal. Both A1 segments are present. Patent anterior communicating artery (a-comm). --Middle cerebral arteries (MCA): Normal. VENOUS SINUSES: As permitted by contrast timing, patent. ANATOMIC VARIANTS: Fetal origins of both posterior cerebral arteries. Review of the MIP images confirms the above findings. IMPRESSION: 1. No intracranial arterial occlusion. 2. Moderate stenosis of the communicating segment of the distal left internal carotid artery. 3. 80% stenosis of the proximal right internal carotid artery secondary to mixed density atherosclerosis. 4. 30% stenosis of the proximal left internal carotid artery secondary to predominantly calcified atherosclerosis. Electronically Signed   By: Deatra Robinson M.D.   On: 01/29/2021 01:51    CT Head Wo Contrast Result Date: 01/28/2021 CLINICAL DATA:  Visual deficit for 10 hours EXAM: CT HEAD WITHOUT CONTRAST TECHNIQUE: Contiguous axial images were  obtained from the base of the skull through the vertex without intravenous contrast. COMPARISON:  None. FINDINGS: Brain: Hypodensity within the left occipital cortex and subcortical white matter consistent with acute or subacute infarct. Chronic ischemic changes are seen within the right basal ganglia. No acute hemorrhage. Lateral ventricles and remaining midline structures are unremarkable. No acute extra-axial fluid collections. No mass effect. Vascular: No hyperdense vessel or unexpected calcification. Skull: Normal. Negative for fracture or focal lesion. Sinuses/Orbits: No acute finding. Other: None. IMPRESSION: 1. Acute to subacute left occipital cortical infarct. 2. No acute hemorrhage. Electronically Signed   By: Sharlet Salina M.D.   On: 01/28/2021 22:28    MR BRAIN WO CONTRAST Result Date: 01/29/2021 CLINICAL DATA:  Neuro deficit, acute stroke suspected. EXAM: MRI HEAD WITHOUT CONTRAST TECHNIQUE: Multiplanar, multiecho pulse sequences of  the brain and surrounding structures were obtained without intravenous contrast. COMPARISON:  CT head from January 28, 2021 and same day CTA. FINDINGS: Brain: Confluent predominantly cortical infarct involving the left occipital lobe. Additional small infarcts involving the left posterior frontal lobe, left basal ganglia, medial left temporal lobe (para hippocampal region) and left cerebellum. Some of these lesions have mild associated edema, most notably the occipital infarct. There is local occipital sulcal effacement without midline shift. No acute hemorrhage. No hydrocephalus. Remote right basal ganglia/corona radiata infarct. Additional mild scattered T2/FLAIR hyperintensities, most likely related to chronic microvascular ischemic disease. Generalized cerebral atrophy. Vascular: Major arterial flow voids are maintained at the skull base. Please see same dayCTA for further evaluation Skull and upper cervical spine: Normal marrow signal. Sinuses/Orbits: Mild  paranasal sinus mucosal thickening without air-fluid levels. Unremarkable orbits. Other: No sizable mastoid effusion. IMPRESSION: 1. Confluent acute or early subacute left occipital infarct with additional small infarcts in the left posterior frontal lobe, left basal ganglia, medial left temporal lobe and left cerebellum. Mild occipital sulcal effacement without midline shift. Involvement of multiple vascular territories raises suspicion for a central embolic etiology. 2. Remote right basal ganglia/corona radiata infarct. Electronically Signed   By: Feliberto Harts MD   On: 01/29/2021 14:01    VAS US CAROTID Result Date: 01/29/2021 Carotid Arterial Duplex Study Indications:       CVA, Speech disturbance and Visual disturbance. Risk Factors:      Hypertension, Diabetes, current smoker. Comparison Study:  No prior carotid exam. CTA Neck on 01/29/21 showed right                    proximal ICA 80% stenosis. Left proximal ICA 30% stenosis. Performing Technologist: Ernestene Mention  Examination Guidelines: A complete evaluation includes B-mode imaging, spectral Doppler, color Doppler, and power Doppler as needed of all accessible portions of each vessel. Bilateral testing is considered an integral part of a complete examination. Limited examinations for reoccurring indications may be performed as noted.  Right Carotid Findings: Summary: Right Carotid: Velocities in the right ICA are consistent with a 60-79%                stenosis. Left Carotid: Velocities in the left ICA are consistent with a 1-39% stenosis.               The ECA appears >50% stenosed. Vertebrals:  Bilateral vertebral arteries demonstrate antegrade flow. Subclavians: Normal flow hemodynamics were seen in bilateral subclavian              arteries. *See table(s) above for measurements and observations.  Electronically signed by Delia Heady MD on 01/29/2021 at 2:17:32 PM.    Final     12-lead ECG SR All prior EKG's in EPIC reviewed with no documented  atrial fibrillation  Telemetry SR  Assessment and Plan:  1. Cryptogenic stroke The patient presents with cryptogenic stroke. Neurology has deferred TEE.  Dr. Ladona Ridgel spoke at length with the patient about monitoring for afib and recommendations for implantable loop recorder.  Discussed transmitting capacity and how the monitor will keep track of his heart rhythm and send the information to our office. Discussed risks, benefits, and alteratives to implantable loop recorder were discussed with the patient today.   The patient was very clear in his decision to proceed with implantable loop recorder.   Wound care was reviewed with the patient (keep incision clean and dry for 3 days).  Wound check will be scheduled for  the patient  Please call with questions.   Renee Norberto Sorenson, PA-C 01/30/2021  EP attending  Patient seen and examined.  Agree with the findings as noted above.  The patient has developed a cryptogenic stroke.  Evaluation thus far has demonstrated no cause of the patient's stroke.  His ejection fraction is normal.  I discussed the indications, risk, goals, benefits, and recommendations for insertion of an implantable loop recorder.  He wishes to proceed.  Lewayne Bunting, MD

## 2021-01-30 NOTE — TOC Transition Note (Addendum)
Transition of Care [facility]) - CM/SW Discharge Note   Patient Details  Name: William Fitzpatrick MRN: [id number] Date of Birth: 1956-11-03  [facility] CM/SW Contact:  Kermit Balo, RN Phone Number: 01/30/2021, 2:15 PM   Clinical Narrative:    Pt discharging home with home health services through [place]. Cory with [facility] accepted the referral. Pt lives alone. CM inquired about transportation needs and he is going to arrange this through the [place]. Cm has paged and left voicemail for his SW through the [place]: [employer]. Pt will need transport home. CM will provide bedside RN a cab voucher for transport.  1427: CM spoke with [place] the SW for [place] and she states they can reimburse his transportation cost if he uses uber/ lyft. She also is going to put in for him to receive an aide at home if he qualifies.   Final next level of care: Home w Home Health Services Barriers to Discharge: No Barriers Identified   Patient Goals and CMS Choice   CMS Medicare.gov Compare Post Acute Care list provided to:: Patient Choice offered to / list presented to : Patient  Discharge Placement                       Discharge Plan and Services                          HH Arranged: RN,PT,OT,Nurse's Drema Pry Work [facility] Agency: [facility] Date [facility] Agency Contacted: 01/30/21   Representative spoke with at [facility] Agency: Kandee Keen  Social Determinants of Health (SDOH) Interventions     Readmission Risk Interventions No flowsheet data found.

## 2021-01-30 NOTE — Discharge Instructions (Signed)
Heart monitor wound care instructions Keep incision clean and dry for 3 days. You can remove outer dressing tomorrow. Leave steri-strips (little pieces of tape) on until seen in the office for wound check appointment. Call the office ([phone]) for redness, drainage, swelling, or fever.  

## 2021-01-30 NOTE — Progress Notes (Signed)
Physical Therapy Treatment Patient Details Name: William Fitzpatrick MRN: [id number] DOB: June 27, 1956 Today's Date: 01/30/2021    History of Present Illness Patient is a 65 y/o male who presents with vision changes. Head CT- acute to subacute infarct in left occipital lobe. PMH includes schizophernia and stroke.    PT Comments    Pt continues to demonstrate improved balance allowing for safer mobility. However, he does continue to display slight gait deviations placing him at risk for falls as is supported by his DGI score of 19 this date. He particularly had difficulty with changes in head positions. Pt was able to ambulate short community distances and navigate stairs safely without an overt LOB and with only min guard assist for safety today. Pt educated on covering R eye when reading to reduce irritation from being unable to see central R visual field, noted success with covering R eye during session. Pt educated to scan surroundings and be aware of changes in surfaces to avoid tripping at home. Will continue to follow acutely. Current recommendations remain appropriate.    Follow Up Recommendations  No PT follow up;Supervision - Intermittent     Equipment Recommendations  None recommended by PT    Recommendations for Other Services       Precautions / Restrictions Precautions Precautions: Fall Precaution Comments: R homonymous hemianopsia Restrictions Weight Bearing Restrictions: No    Mobility  Bed Mobility Overal bed mobility: Needs Assistance Bed Mobility: Supine to Sit     Supine to sit: Min guard     General bed mobility comments: HOB flat with Min guard and increased time to elevate trunk.    Transfers Overall transfer level: Needs assistance Equipment used: None Transfers: Sit to/from Stand Sit to Stand: Min guard         General transfer comment: Min guard for safety. Stood from [employer].  Ambulation/Gait Ambulation/Gait assistance: Min guard Gait Distance  (Feet): 200 Feet Assistive device: None Gait Pattern/deviations: Step-through pattern;Decreased stride length;Decreased stance time - left;Decreased step length - right Gait velocity: reduced Gait velocity interpretation: 1.31 - 2.62 ft/sec, indicative of limited community ambulator General Gait Details: Slow, mildly unsteady gait but no overt LOB. Decreased L stance time and L leg stiffness with swing noted, pt reprots hx of L hip pain and issues. Min guard for safety.   Stairs Stairs: Yes Stairs assistance: Min guard Stair Management: One rail Right;Alternating pattern Number of Stairs: 2 General stair comments: Ascending and descending 2 stairs with reciprocal pattern and use of 1 rail, no LOB. Min guard for safety.   Wheelchair Mobility    Modified Rankin (Stroke Patients Only) Modified Rankin (Stroke Patients Only) Pre-Morbid Rankin Score: Slight disability Modified Rankin: Moderate disability     Balance Overall balance assessment: Needs assistance Sitting-balance support: Feet supported;No upper extremity supported Sitting balance-Leahy Scale: Good     Standing balance support: During functional activity;No upper extremity supported Standing balance-Leahy Scale: Good Standing balance comment: Slight balance deficits when performing DGI, but able to recover.                 Standardized Balance Assessment Standardized Balance Assessment : Dynamic Gait Index   Dynamic Gait Index Level Surface: Mild Impairment Change in Gait Speed: Normal Gait with Horizontal Head Turns: Mild Impairment Gait with Vertical Head Turns: Mild Impairment Gait and Pivot Turn: Normal Step Over Obstacle: Mild Impairment Step Around Obstacles: Normal Steps: Mild Impairment Total Score: 19      Cognition Arousal/Alertness: Awake/alert Behavior During Therapy:  WFL for tasks assessed/performed Overall Cognitive Status: Within Functional Limits for tasks assessed                                  General Comments: For tasks performed, [facility] cognition this date.      Exercises      General Comments        Pertinent Vitals/Pain Pain Assessment: No/denies pain    Home Living                      Prior Function            PT Goals (current goals can now be found in the care plan section) Acute Rehab PT Goals Patient Stated Goal: to get better and go home PT Goal Formulation: With patient Time For Goal Achievement: 02/12/21 Potential to Achieve Goals: Good Progress towards PT goals: Progressing toward goals    Frequency    Min 4X/week      PT Plan Current plan remains appropriate    Co-evaluation              AM-PAC PT "6 Clicks" Mobility   Outcome Measure  Help needed turning from your back to your side while in a flat bed without using bedrails?: A Little Help needed moving from lying on your back to sitting on the side of a flat bed without using bedrails?: A Little Help needed moving to and from a bed to a chair (including a wheelchair)?: A Little Help needed standing up from a chair using your arms (e.g., wheelchair or bedside chair)?: A Little Help needed to walk in hospital room?: A Little Help needed climbing 3-5 steps with a railing? : A Little 6 Click Score: 18    End of Session Equipment Utilized During Treatment: Gait belt Activity Tolerance: Patient tolerated treatment well Patient left: with call bell/phone within reach;in chair;with chair alarm set Nurse Communication: Mobility status PT Visit Diagnosis: Unsteadiness on feet (R26.81);Other abnormalities of gait and mobility (R26.89);Other symptoms and signs involving the nervous system (R29.898)     Time: 6283-1517 PT Time Calculation (min) (ACUTE ONLY): 15 min  Charges:  $Gait Training: 8-22 mins                     [name], PT, DPT Acute Rehabilitation Services  Pager: [phone] Office: [phone]    [name] 01/30/2021, 5:26 PM

## 2021-01-30 NOTE — Discharge Summary (Signed)
Physician Discharge Summary  William Fitzpatrick MBE:[id number] DOB: 1956-03-04 DOA: 01/28/2021  [facility]  Admit date: 01/28/2021 Discharge date: 01/30/2021  Admitted From: Home Disposition: Home  Recommendations for Outpatient Follow-up:  1. Continue to encourage smoking sensation  Home Health:  ordered  Discharge Condition:  stable   CODE STATUS:  Full code   Diet recommendation:  Heart healthy and diabetic Consultations:  Neurology  Procedures/Studies: .    Discharge Diagnoses:  Principal Problem:   Acute cerebrovascular accident (CVA) due to occlusion of left posterior cerebral artery (HCC) Active Problems:   Schizophrenia (HCC)   Type 2 diabetes mellitus with hyperglycemia, with long-term current use of insulin (HCC)   Nicotine dependence, other tobacco product, uncomplicated   Essential hypertension   Hypothyroidism     Brief Summary: 65 year old male with diabetes mellitus type 2, hypertension, hypothyroidism, nicotine abuse, Hypertension and schizophrenia who presents to the hospital with 10 hours of visual changes.  In the ED CT revealed acute to subacute left occipital cortical infarct.  Hospital Course:  Acute ischemic infarct resulting in vision loss -Vision loss improving but not back to baseline -CTA revealed moderate stenosis of the communicating segment of the left internal carotid artery with 80% stenosis of the proximal right internal carotid artery secondary to a mixed density atherosclerosis, 30% stenosis of the proximal left internal carotid artery -LDL 133 -Hemoglobin A1c 8.9 -MRI brain: Confluent acute or early subacute left occipital infarct with additional small infarcts in the left posterior frontal lobe, left basal ganglia, medial left temporal lobe and left cerebellum. Mild occipital sulcal effacement without midline shift. - 2 D ECHO does not show a thrombus- EF is normal- no diastolic CHF -Neurology recommends aspirin and  Plavix for 3 weeks followed by Plavix alone -As the stroke was suspected to be embolic, the patient had loop recorder placed today by Dr. Ladona Ridgel   Hypertension -Antihypertensives have been on hold due to acute CVA I will write for them to be resumed on the 18th  Diabetes mellitus type II -A1c is uncontrolled at 8.9 -Recommend close outpatient follow-up  Nicotine abuse -Advised to stop smoking  Schizophrenia -Controlled on Zyprexa    Discharge Exam: Vitals:   01/30/21 0412 01/30/21 0515  BP: (!) 142/76   Pulse: (!) 56   Resp: 12 16  Temp: 97.8 F (36.6 C)   SpO2: 98% 94%   Vitals:   01/30/21 0011 01/30/21 0400 01/30/21 0412 01/30/21 0515  BP: (!) 152/67 (!) 145/79 (!) 142/76   Pulse: (!) 59  (!) 56   Resp: 16 15 12 16   Temp: 98.2 F (36.8 C)  97.8 F (36.6 C)   TempSrc: Oral  Oral   SpO2: 93% 95% 98% 94%  Weight:        General: Pt is alert, awake, not in acute distress Cardiovascular: RRR, S1/S2 +, no rubs, no gallops Respiratory: CTA bilaterally, no wheezing, no rhonchi Abdominal: Soft, NT, ND, bowel sounds + Extremities: no edema, no cyanosis   Discharge Instructions  Discharge Instructions    Diet - low sodium heart healthy   Complete by: As directed    Diet Carb Modified   Complete by: As directed    Increase activity slowly   Complete by: As directed      Allergies as of 01/30/2021   No Known Allergies     Medication List    TAKE these medications   amLODipine 10 MG tablet Commonly known as: NORVASC Take 1 tablet (10 mg total)  by mouth daily. Restart on 2/18 What changed: additional instructions   aspirin 81 MG EC tablet Take 1 tablet (81 mg total) by mouth daily. Swallow whole. Start taking on: January 31, 2021   clopidogrel 75 MG tablet Commonly known as: PLAVIX Take 1 tablet (75 mg total) by mouth daily. Start taking on: January 31, 2021   Dexcom G6 Sensor Misc 1 Device by Does not apply route See admin instructions.    hydrochlorothiazide 12.5 MG capsule Commonly known as: MICROZIDE Take 1 capsule (12.5 mg total) by mouth daily. Resume on 2/18 What changed: additional instructions   insulin aspart 100 UNIT/ML injection Commonly known as: novoLOG For use in pump, total of 60 units per day.   levothyroxine 75 MCG tablet Commonly known as: SYNTHROID Take 75 mcg by mouth daily before breakfast.   metFORMIN 500 MG 24 hr tablet Commonly known as: GLUCOPHAGE-XR Take 1,000 mg by mouth 2 (two) times daily.   nicotine 21 mg/24hr patch Commonly known as: NICODERM CQ - dosed in mg/24 hours Place 1 patch (21 mg total) onto the skin daily. Start taking on: January 31, 2021   OLANZapine 20 MG tablet Commonly known as: ZYPREXA Take 10 mg by mouth at bedtime.   OVER THE COUNTER MEDICATION Take 1 tablet by mouth in the morning and at bedtime.   PREVAGEN PO Take 1 capsule by mouth daily.   rosuvastatin 20 MG tablet Commonly known as: CRESTOR Take 1 tablet (20 mg total) by mouth daily. Start taking on: January 31, 2021   Rybelsus 3 MG Tabs Generic drug: Semaglutide Take 3 mg by mouth daily.       Follow-up Information    [facility] Follow up.   Specialty: Home Health Services Why: The home health agency will contact you for the first home visit. Contact information: [address] [place] [address] [phone]        [facility] [address] Office Follow up.   Specialty: Cardiology Why: 02/14/2021 @ 10:40AM, wound check visit (heart monitor) Contact information: [address] [place] [address] [phone]       [facility] Follow up.   Contact information: [phone]  [facility] worker for [facility]             No Known Allergies    CT ANGIO HEAD W OR WO CONTRAST  Result Date: 01/29/2021 CLINICAL DATA:  TIA.  Search for embolic source. EXAM: CT ANGIOGRAPHY HEAD AND NECK TECHNIQUE: Multidetector CT  imaging of the head and neck was performed using the standard protocol during bolus administration of intravenous contrast. Multiplanar CT image reconstructions and MIPs were obtained to evaluate the vascular anatomy. Carotid stenosis measurements (when applicable) are obtained utilizing NASCET criteria, using the distal internal carotid diameter as the denominator. CONTRAST:  80mL OMNIPAQUE IOHEXOL 350 MG/ML SOLN COMPARISON:  None. FINDINGS: CTA NECK FINDINGS SKELETON: There is no bony spinal canal stenosis. No lytic or blastic lesion. OTHER NECK: Normal pharynx, larynx and major salivary glands. No cervical lymphadenopathy. Unremarkable thyroid gland. UPPER CHEST: No pneumothorax or pleural effusion. No nodules or masses. AORTIC ARCH: There is no calcific atherosclerosis of the aortic arch. There is no aneurysm, dissection or hemodynamically significant stenosis of the visualized portion of the aorta. Conventional 3 vessel aortic branching pattern. The visualized proximal subclavian arteries are widely patent. RIGHT CAROTID SYSTEM: No dissection, occlusion or aneurysm. There is mixed density atherosclerosis extending into the proximal ICA, resulting in 80%  stenosis. LEFT CAROTID SYSTEM: No dissection, occlusion or aneurysm. There is predominantly calcified atherosclerosis extending into the proximal ICA, resulting in 30% stenosis. VERTEBRAL ARTERIES: Left dominant configuration. Both origins are clearly patent. There is no dissection, occlusion or flow-limiting stenosis to the skull base (V1-V3 segments). CTA HEAD FINDINGS POSTERIOR CIRCULATION: --Vertebral arteries: Normal V4 segments. --Inferior cerebellar arteries: Normal. --Basilar artery: Normal. --Superior cerebellar arteries: Normal. --Posterior cerebral arteries (PCA): Normal. ANTERIOR CIRCULATION: --Intracranial internal carotid arteries: Moderate stenosis of the communicating segment of the distal left ICA. Bilateral atherosclerotic calcification.  --Anterior cerebral arteries (ACA): Normal. Both A1 segments are present. Patent anterior communicating artery (a-comm). --Middle cerebral arteries (MCA): Normal. VENOUS SINUSES: As permitted by contrast timing, patent. ANATOMIC VARIANTS: Fetal origins of both posterior cerebral arteries. Review of the MIP images confirms the above findings. IMPRESSION: 1. No intracranial arterial occlusion. 2. Moderate stenosis of the communicating segment of the distal left internal carotid artery. 3. 80% stenosis of the proximal right internal carotid artery secondary to mixed density atherosclerosis. 4. 30% stenosis of the proximal left internal carotid artery secondary to predominantly calcified atherosclerosis. Electronically Signed   By: Deatra RobinsonKevin  Herman M.D.   On: 01/29/2021 01:51   CT Head Wo Contrast  Result Date: 01/28/2021 CLINICAL DATA:  Visual deficit for 10 hours EXAM: CT HEAD WITHOUT CONTRAST TECHNIQUE: Contiguous axial images were obtained from the base of the skull through the vertex without intravenous contrast. COMPARISON:  None. FINDINGS: Brain: Hypodensity within the left occipital cortex and subcortical white matter consistent with acute or subacute infarct. Chronic ischemic changes are seen within the right basal ganglia. No acute hemorrhage. Lateral ventricles and remaining midline structures are unremarkable. No acute extra-axial fluid collections. No mass effect. Vascular: No hyperdense vessel or unexpected calcification. Skull: Normal. Negative for fracture or focal lesion. Sinuses/Orbits: No acute finding. Other: None. IMPRESSION: 1. Acute to subacute left occipital cortical infarct. 2. No acute hemorrhage. Electronically Signed   By: Sharlet SalinaMichael  Brown M.D.   On: 01/28/2021 22:28   CT ANGIO NECK W OR WO CONTRAST  Result Date: 01/29/2021 CLINICAL DATA:  TIA.  Search for embolic source. EXAM: CT ANGIOGRAPHY HEAD AND NECK TECHNIQUE: Multidetector CT imaging of the head and neck was performed using the  standard protocol during bolus administration of intravenous contrast. Multiplanar CT image reconstructions and MIPs were obtained to evaluate the vascular anatomy. Carotid stenosis measurements (when applicable) are obtained utilizing NASCET criteria, using the distal internal carotid diameter as the denominator. CONTRAST:  80mL OMNIPAQUE IOHEXOL 350 MG/ML SOLN COMPARISON:  None. FINDINGS: CTA NECK FINDINGS SKELETON: There is no bony spinal canal stenosis. No lytic or blastic lesion. OTHER NECK: Normal pharynx, larynx and major salivary glands. No cervical lymphadenopathy. Unremarkable thyroid gland. UPPER CHEST: No pneumothorax or pleural effusion. No nodules or masses. AORTIC ARCH: There is no calcific atherosclerosis of the aortic arch. There is no aneurysm, dissection or hemodynamically significant stenosis of the visualized portion of the aorta. Conventional 3 vessel aortic branching pattern. The visualized proximal subclavian arteries are widely patent. RIGHT CAROTID SYSTEM: No dissection, occlusion or aneurysm. There is mixed density atherosclerosis extending into the proximal ICA, resulting in 80% stenosis. LEFT CAROTID SYSTEM: No dissection, occlusion or aneurysm. There is predominantly calcified atherosclerosis extending into the proximal ICA, resulting in 30% stenosis. VERTEBRAL ARTERIES: Left dominant configuration. Both origins are clearly patent. There is no dissection, occlusion or flow-limiting stenosis to the skull base (V1-V3 segments). CTA HEAD FINDINGS POSTERIOR CIRCULATION: --Vertebral arteries: Normal V4 segments. --Inferior cerebellar  arteries: Normal. --Basilar artery: Normal. --Superior cerebellar arteries: Normal. --Posterior cerebral arteries (PCA): Normal. ANTERIOR CIRCULATION: --Intracranial internal carotid arteries: Moderate stenosis of the communicating segment of the distal left ICA. Bilateral atherosclerotic calcification. --Anterior cerebral arteries (ACA): Normal. Both A1  segments are present. Patent anterior communicating artery (a-comm). --Middle cerebral arteries (MCA): Normal. VENOUS SINUSES: As permitted by contrast timing, patent. ANATOMIC VARIANTS: Fetal origins of both posterior cerebral arteries. Review of the MIP images confirms the above findings. IMPRESSION: 1. No intracranial arterial occlusion. 2. Moderate stenosis of the communicating segment of the distal left internal carotid artery. 3. 80% stenosis of the proximal right internal carotid artery secondary to mixed density atherosclerosis. 4. 30% stenosis of the proximal left internal carotid artery secondary to predominantly calcified atherosclerosis. Electronically Signed   By: Deatra Robinson M.D.   On: 01/29/2021 01:51   MR BRAIN WO CONTRAST  Result Date: 01/29/2021 CLINICAL DATA:  Neuro deficit, acute stroke suspected. EXAM: MRI HEAD WITHOUT CONTRAST TECHNIQUE: Multiplanar, multiecho pulse sequences of the brain and surrounding structures were obtained without intravenous contrast. COMPARISON:  CT head from January 28, 2021 and same day CTA. FINDINGS: Brain: Confluent predominantly cortical infarct involving the left occipital lobe. Additional small infarcts involving the left posterior frontal lobe, left basal ganglia, medial left temporal lobe (para hippocampal region) and left cerebellum. Some of these lesions have mild associated edema, most notably the occipital infarct. There is local occipital sulcal effacement without midline shift. No acute hemorrhage. No hydrocephalus. Remote right basal ganglia/corona radiata infarct. Additional mild scattered T2/FLAIR hyperintensities, most likely related to chronic microvascular ischemic disease. Generalized cerebral atrophy. Vascular: Major arterial flow voids are maintained at the skull base. Please see same dayCTA for further evaluation Skull and upper cervical spine: Normal marrow signal. Sinuses/Orbits: Mild paranasal sinus mucosal thickening without air-fluid  levels. Unremarkable orbits. Other: No sizable mastoid effusion. IMPRESSION: 1. Confluent acute or early subacute left occipital infarct with additional small infarcts in the left posterior frontal lobe, left basal ganglia, medial left temporal lobe and left cerebellum. Mild occipital sulcal effacement without midline shift. Involvement of multiple vascular territories raises suspicion for a central embolic etiology. 2. Remote right basal ganglia/corona radiata infarct. Electronically Signed   By: Feliberto Harts MD   On: 01/29/2021 14:01   EP PPM/ICD IMPLANT  Result Date: 01/30/2021 CONCLUSIONS:  1. Successful implantation of a [employer] Reveal LINQ implantable loop recorder for cryptogenic stroke  2. No early apparent complications. Lewayne Bunting, MD 01/30/2021 12:24 PM   ECHOCARDIOGRAM COMPLETE BUBBLE STUDY  Result Date: 01/29/2021    ECHOCARDIOGRAM REPORT   Patient Name:   William Fitzpatrick Date of Exam: 01/29/2021 Medical Rec #:  [id number]       Height:       68.0 in Accession #:    [id number]      Weight:       189.4 lb Date of Birth:  [birth year]        BSA:          1.997 m Patient Age:    65 years        BP:           113/55 mmHg Patient Gender: M               HR:           56 bpm. Exam Location:  Inpatient Procedure: 2D Echo, Cardiac Doppler and Color Doppler Indications:   Stroke 434.91 / I63.9  History:  Patient has no prior history of Echocardiogram examinations.                Stroke.  Sonographer:   Eulah Pont RDCS Referring      [id number] Marinda Elk Phys: IMPRESSIONS  1. Left ventricular ejection fraction, by estimation, is 55 to 60%. The left ventricle has normal function. The left ventricle has no regional wall motion abnormalities. Left ventricular diastolic parameters are consistent with Grade I diastolic dysfunction (impaired relaxation).  2. Right ventricular systolic function is normal. The right ventricular size is normal. Tricuspid regurgitation signal is inadequate for  assessing PA pressure.  3. The mitral valve is normal in structure. No evidence of mitral valve regurgitation. No evidence of mitral stenosis.  4. The aortic valve is normal in structure. Aortic valve regurgitation is not visualized. Mild aortic valve sclerosis is present, with no evidence of aortic valve stenosis.  5. The inferior vena cava is normal in size with greater than 50% respiratory variability, suggesting right atrial pressure of 3 mmHg.  6. Agitated saline contrast bubble study was negative, with no evidence of any interatrial shunt. FINDINGS  Left Ventricle: Left ventricular ejection fraction, by estimation, is 55 to 60%. The left ventricle has normal function. The left ventricle has no regional wall motion abnormalities. The left ventricular internal cavity size was normal in size. There is  no left ventricular hypertrophy. Left ventricular diastolic parameters are consistent with Grade I diastolic dysfunction (impaired relaxation). Indeterminate filling pressures. Right Ventricle: The right ventricular size is normal. No increase in right ventricular wall thickness. Right ventricular systolic function is normal. Tricuspid regurgitation signal is inadequate for assessing PA pressure. Left Atrium: Left atrial size was normal in size. Right Atrium: Right atrial size was normal in size. Pericardium: There is no evidence of pericardial effusion. Mitral Valve: The mitral valve is normal in structure. No evidence of mitral valve regurgitation. No evidence of mitral valve stenosis. Tricuspid Valve: The tricuspid valve is normal in structure. Tricuspid valve regurgitation is not demonstrated. No evidence of tricuspid stenosis. Aortic Valve: The aortic valve is normal in structure. Aortic valve regurgitation is not visualized. Mild aortic valve sclerosis is present, with no evidence of aortic valve stenosis. Pulmonic Valve: The pulmonic valve was normal in structure. Pulmonic valve regurgitation is not  visualized. No evidence of pulmonic stenosis. Aorta: The aortic root is normal in size and structure. Venous: The inferior vena cava is normal in size with greater than 50% respiratory variability, suggesting right atrial pressure of 3 mmHg. IAS/Shunts: No atrial level shunt detected by color flow Doppler. Agitated saline contrast bubble study was negative, with no evidence of any interatrial shunt.  LEFT VENTRICLE PLAX 2D LVIDd:         5.20 cm  Diastology LVIDs:         3.90 cm  LV e' medial:    5.22 cm/s LV PW:         0.90 cm  LV E/e' medial:  12.5 LV IVS:        0.90 cm  LV e' lateral:   7.07 cm/s LVOT diam:     2.20 cm  LV E/e' lateral: 9.2 LV SV:         82 LV SV Index:   41 LVOT Area:     3.80 cm  RIGHT VENTRICLE RV S prime:     8.59 cm/s TAPSE (M-mode): 1.7 cm LEFT ATRIUM  Index       RIGHT ATRIUM           Index LA diam:        3.70 cm 1.85 cm/m  RA Area:     12.10 cm LA Vol (A2C):   58.9 ml 29.50 ml/m RA Volume:   26.10 ml  13.07 ml/m LA Vol (A4C):   38.8 ml 19.43 ml/m LA Biplane Vol: 48.4 ml 24.24 ml/m  AORTIC VALVE LVOT Vmax:   93.30 cm/s LVOT Vmean:  64.600 cm/s LVOT VTI:    0.217 m  AORTA Ao Root diam: 3.70 cm Ao Asc diam:  3.60 cm MITRAL VALVE MV Area (PHT): 2.16 cm    SHUNTS MV Decel Time: 352 msec    Systemic VTI:  0.22 m MV E velocity: 65.10 cm/s  Systemic Diam: 2.20 cm MV A velocity: 84.00 cm/s MV E/A ratio:  0.77 Mihai Croitoru MD Electronically signed by Thurmon Fair MD Signature Date/Time: 01/29/2021/4:25:02 PM    Final    VAS US CAROTID  Result Date: 01/29/2021 Carotid Arterial Duplex Study Indications:       CVA, Speech disturbance and Visual disturbance. Risk Factors:      Hypertension, Diabetes, current smoker. Comparison Study:  No prior carotid exam. CTA Neck on 01/29/21 showed right                    proximal ICA 80% stenosis. Left proximal ICA 30% stenosis. Performing Technologist: Ernestene Mention  Examination Guidelines: A complete evaluation includes B-mode imaging,  spectral Doppler, color Doppler, and power Doppler as needed of all accessible portions of each vessel. Bilateral testing is considered an integral part of a complete examination. Limited examinations for reoccurring indications may be performed as noted.  Right Carotid Findings: +----------+--------+--------+--------+------------------+------------------+           PSV cm/sEDV cm/sStenosisPlaque DescriptionComments           +----------+--------+--------+--------+------------------+------------------+ CCA Prox  61      17                                intimal thickening +----------+--------+--------+--------+------------------+------------------+ CCA Distal63      20                                intimal thickening +----------+--------+--------+--------+------------------+------------------+ ICA Prox  159     62      60-79%  calcific                             +----------+--------+--------+--------+------------------+------------------+ ICA Mid   116     38                                                   +----------+--------+--------+--------+------------------+------------------+ ICA Distal71      26                                                   +----------+--------+--------+--------+------------------+------------------+ ECA       80      9                                                    +----------+--------+--------+--------+------------------+------------------+ +----------+--------+-------+----------------+-------------------+  PSV cm/sEDV cmsDescribe        Arm Pressure (mmHG) +----------+--------+-------+----------------+-------------------+ [id number]      0      Multiphasic, WNL                    +----------+--------+-------+----------------+-------------------+ +---------+--------+--+--------+--+---------+ VertebralPSV cm/s47EDV cm/s19Antegrade +---------+--------+--+--------+--+---------+  Left Carotid Findings:  +----------+--------+-------+--------+----------------------+------------------+           PSV cm/sEDV    StenosisPlaque Description    Comments                             cm/s                                                    +----------+--------+-------+--------+----------------------+------------------+ CCA Prox  65      9                                    intimal thickening +----------+--------+-------+--------+----------------------+------------------+ CCA Distal61      10             smooth and            intimal thickening                                  heterogenous                             +----------+--------+-------+--------+----------------------+------------------+ ICA Prox  41      12     1-39%                                            +----------+--------+-------+--------+----------------------+------------------+ ICA Distal34      13                                                      +----------+--------+-------+--------+----------------------+------------------+ ECA       201     19     >50%    heterogenous                             +----------+--------+-------+--------+----------------------+------------------+ +----------+--------+--------+----------------+-------------------+           PSV cm/sEDV cm/sDescribe        Arm Pressure (mmHG) +----------+--------+--------+----------------+-------------------+ [id number]      0       Multiphasic, WNL                    +----------+--------+--------+----------------+-------------------+ +---------+--------+--+--------+--+---------+ VertebralPSV cm/s45EDV cm/s12Antegrade +---------+--------+--+--------+--+---------+   Summary: Right Carotid: Velocities in the right ICA are consistent with a 60-79%                stenosis. Left Carotid: Velocities in the left ICA are consistent with a 1-39% stenosis.  The ECA appears >50% stenosed. Vertebrals:  Bilateral  vertebral arteries demonstrate antegrade flow. Subclavians: Normal flow hemodynamics were seen in bilateral subclavian              arteries. *See table(s) above for measurements and observations.  Electronically signed by Delia Heady MD on 01/29/2021 at 2:17:32 PM.    Final      The results of significant diagnostics from this hospitalization (including imaging, microbiology, ancillary and laboratory) are listed below for reference.     Microbiology: Recent Results (from the past 240 hour(s))  SARS CORONAVIRUS 2 (TAT 6-24 HRS) Nasopharyngeal Nasopharyngeal Swab     Status: None   Collection Time: 01/28/21 10:54 PM   Specimen: Nasopharyngeal Swab  Result Value Ref Range Status   SARS Coronavirus 2 NEGATIVE NEGATIVE Final    Comment: (NOTE) SARS-CoV-2 target nucleic acids are NOT DETECTED.  The SARS-CoV-2 RNA is generally detectable in upper and lower respiratory specimens during the acute phase of infection. Negative results do not preclude SARS-CoV-2 infection, do not rule out co-infections with other pathogens, and should not be used as the sole basis for treatment or other patient management decisions. Negative results must be combined with clinical observations, patient history, and epidemiological information. The expected result is Negative.  Fact Sheet for Patients: HairSlick.no  Fact Sheet for Healthcare Providers: quierodirigir.com  This test is not yet approved or cleared by the [place] FDA and  has been authorized for detection and/or diagnosis of SARS-CoV-2 by FDA under an Emergency Use Authorization (EUA). This EUA will remain  in effect (meaning this test can be used) for the duration of the COVID-19 declaration under Se ction 564(b)(1) of the Act, 21 U.S.C. section 360bbb-3(b)(1), unless the authorization is terminated or revoked sooner.  Performed at [facility], [address], [place],  [place] [address]      Labs: BNP (last 3 results) No results for input(s): BNP in the last 8760 hours. Basic Metabolic Panel: Recent Labs  Lab 01/28/21 2218 01/29/21 0057  NA 134* 135  K 3.6 3.5  CL 101 104  CO2 23 20*  GLUCOSE 220* 162*  BUN 15 12  CREATININE 0.87 0.71  CALCIUM 9.4 9.3   Liver Function Tests: Recent Labs  Lab 01/29/21 0057  AST 16  ALT 18  ALKPHOS 91  BILITOT 0.6  PROT 6.3*  ALBUMIN 3.7   No results for input(s): LIPASE, AMYLASE in the last 168 hours. No results for input(s): AMMONIA in the last 168 hours. CBC: Recent Labs  Lab 01/28/21 2218 01/29/21 0057  WBC 10.3 8.8  NEUTROABS 6.7 5.6  HGB 16.4 16.0  HCT 46.9 43.7  MCV 91.4 88.3  PLT 173 177   Cardiac Enzymes: No results for input(s): CKTOTAL, CKMB, CKMBINDEX, TROPONINI in the last 168 hours. BNP: Invalid input(s): POCBNP CBG: Recent Labs  Lab 01/29/21 1649 01/29/21 2108 01/30/21 0358 01/30/21 0617 01/30/21 1235  GLUCAP 224* 216* 158* 127* 178*   D-Dimer No results for input(s): DDIMER in the last 72 hours. Hgb A1c Recent Labs    01/29/21 0057  HGBA1C 9.4*   Lipid Profile Recent Labs    01/29/21 0057  CHOL 199  HDL 44  LDLCALC 133*  TRIG 112  CHOLHDL 4.5   Thyroid function studies No results for input(s): TSH, T4TOTAL, T3FREE, THYROIDAB in the last 72 hours.  Invalid input(s): FREET3 Anemia work up No results for input(s): VITAMINB12, FOLATE, FERRITIN, TIBC, IRON, RETICCTPCT in the last 72 hours.  Urinalysis No results found for: COLORURINE, APPEARANCEUR, LABSPEC, PHURINE, GLUCOSEU, HGBUR, BILIRUBINUR, KETONESUR, PROTEINUR, UROBILINOGEN, NITRITE, LEUKOCYTESUR Sepsis Labs Invalid input(s): PROCALCITONIN,  WBC,  LACTICIDVEN Microbiology Recent Results (from the past 240 hour(s))  SARS CORONAVIRUS 2 (TAT 6-24 HRS) Nasopharyngeal Nasopharyngeal Swab     Status: None   Collection Time: 01/28/21 10:54 PM   Specimen: Nasopharyngeal Swab  Result Value Ref Range Status    SARS Coronavirus 2 NEGATIVE NEGATIVE Final    Comment: (NOTE) SARS-CoV-2 target nucleic acids are NOT DETECTED.  The SARS-CoV-2 RNA is generally detectable in upper and lower respiratory specimens during the acute phase of infection. Negative results do not preclude SARS-CoV-2 infection, do not rule out co-infections with other pathogens, and should not be used as the sole basis for treatment or other patient management decisions. Negative results must be combined with clinical observations, patient history, and epidemiological information. The expected result is Negative.  Fact Sheet for Patients: HairSlick.no  Fact Sheet for Healthcare Providers: quierodirigir.com  This test is not yet approved or cleared by the [place] FDA and  has been authorized for detection and/or diagnosis of SARS-CoV-2 by FDA under an Emergency Use Authorization (EUA). This EUA will remain  in effect (meaning this test can be used) for the duration of the COVID-19 declaration under Se ction 564(b)(1) of the Act, 21 U.S.C. section 360bbb-3(b)(1), unless the authorization is terminated or revoked sooner.  Performed at [facility], [address], [place], [place] [address]      Time coordinating discharge in minutes: 65  SIGNED:   Calvert Cantor, MD  [facility] 01/30/2021, 3:38 PM

## 2021-02-04 ENCOUNTER — Telehealth: Payer: Self-pay | Admitting: Endocrinology

## 2021-02-04 NOTE — Telephone Encounter (Signed)
FYI

## 2021-02-04 NOTE — Telephone Encounter (Signed)
Patient called to advise that a DME company would be sending in paperwork to be completed for a new pump. Patient also wanted to advise Dr Everardo All that he has had a stroke and was in patient at [facility] 01/28/21 through 01/31/21

## 2021-02-07 ENCOUNTER — Other Ambulatory Visit: Payer: Self-pay | Admitting: *Deleted

## 2021-02-07 ENCOUNTER — Telehealth (HOSPITAL_COMMUNITY): Payer: Self-pay

## 2021-02-07 NOTE — Patient Outreach (Signed)
[facility]) Care Management  02/07/2021  William Fitzpatrick 1956/12/08 [id number]   [facility] outreach for EMMI  RED ON EMMI ALERT Day #     1      Date: Friday 02/01/21 1000 Red Alert Reason: Scheduled a follow-up appointment? No Problems setting up rehab? Didn't Need Any Insurance:  [facility] admissions x  ED visits x in the last 6 months    Outreach attempt # 1 Patient is able to verify HIPAA identifiers [facility] Care Management RN reviewed and addressed red alert with patient  Consent: [facility] RN CM reviewed [facility] services with patient. Patient gave verbal consent for services [facility] telephonic RN CM.   Advised patient that there will be further automated EMMI- post discharge calls to assess how the patient is doing following the recent hospitalization Advised the patient that another call may be received from a nurse if any of their responses were abnormal. Patient voiced understanding and was appreciative of f/u call.    EMMI:  Reports the EMMI answers were correct at the time of the call He does have follow up appointment with his primary care provider (PCP), neurologist and cardiologist He was seen by his eye dr on 02/05/21 and given permission to drive  Subjective " I don't need rehab"    Patient Active Problem List   Diagnosis Date Noted  . Nicotine dependence, other tobacco product, uncomplicated 01/29/2021  . Essential hypertension 01/29/2021  . Hypothyroidism 01/29/2021  . 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 Type 2 diabetes mellitus with hyperglycemia, with long-term current use of insulin (HCC) 10/27/2020  . Schizophrenia (HCC) 10/01/2019  . Suicidal thoughts    Medications: Had outreach form pharmacy clinic today and denies issue with obtaining or cost of medicines     Plan: Patient requests no follow up at this time [facility] RN CM will close case  [name], RN, BSN, CCM [facility]  Telephonic Care Management Care Coordinator Office number [phone] Mobile number [phone]  Main [facility] number [phone] Fax number [phone]

## 2021-02-07 NOTE — Telephone Encounter (Signed)
Pharmacy Transitions of Care Follow-up Telephone Call  Date of discharge:  01/30/21 Discharge Diagnosis: CVA  How have you been since you were released from the hospital? Patient has been well since discharge, knows s/sx of bleeding to look out for  Medication changes made at discharge: yes  Medication changes obtained and verified? yes    Medication Accessibility:  Home Pharmacy: [facility] on [address] [facility]  Was the patient provided with refills on discharged medications? yes  Have all prescriptions been transferred from [facility] to home pharmacy? yes  . Is the patient able to afford medications? yes    Medication Review:  CLOPIDOGREL (PLAVIX) Clopidogrel 75 mg once daily.  - Educated patient on expected duration of therapy with clopidogrel. Advised patient that aspirin will be continued indefinitely.  - Advised patient of medications to avoid (NSAIDs, ASA)  - Educated that Tylenol (acetaminophen) will be the preferred analgesic to prevent risk of bleeding  - Emphasized importance of monitoring for signs and symptoms of bleeding (abnormal bruising, prolonged bleeding, nose bleeds, bleeding from gums, discolored urine, black tarry stools)  - Advised patient to alert all providers of anticoagulation therapy prior to starting a new medication or having a procedure    Follow-up Appointments:  Patient states he has follow up with his Neurologist next week and PCP (Dr. Everardo All on 3/15) the following week. Has follow up with Cardiology on 02/14/21.   If their condition worsens, is the pt aware to call PCP or go to the Emergency Dept.? yes  Final Patient Assessment: Patient doing well. Has f/u scheduled and refills at home pharmacy

## 2021-02-11 ENCOUNTER — Encounter: Payer: Self-pay | Admitting: *Deleted

## 2021-02-11 DIAGNOSIS — R259 Unspecified abnormal involuntary movements: Secondary | ICD-10-CM | POA: Insufficient documentation

## 2021-02-11 DIAGNOSIS — M67919 Unspecified disorder of synovium and tendon, unspecified shoulder: Secondary | ICD-10-CM | POA: Insufficient documentation

## 2021-02-11 DIAGNOSIS — H6123 Impacted cerumen, bilateral: Secondary | ICD-10-CM | POA: Insufficient documentation

## 2021-02-11 DIAGNOSIS — Z23 Encounter for immunization: Secondary | ICD-10-CM | POA: Insufficient documentation

## 2021-02-11 DIAGNOSIS — L219 Seborrheic dermatitis, unspecified: Secondary | ICD-10-CM | POA: Insufficient documentation

## 2021-02-11 DIAGNOSIS — E559 Vitamin D deficiency, unspecified: Secondary | ICD-10-CM | POA: Insufficient documentation

## 2021-02-11 DIAGNOSIS — E7889 Other lipoprotein metabolism disorders: Secondary | ICD-10-CM | POA: Insufficient documentation

## 2021-02-11 DIAGNOSIS — Z0489 Encounter for examination and observation for other specified reasons: Secondary | ICD-10-CM | POA: Insufficient documentation

## 2021-02-11 DIAGNOSIS — H526 Other disorders of refraction: Secondary | ICD-10-CM | POA: Insufficient documentation

## 2021-02-11 DIAGNOSIS — E669 Obesity, unspecified: Secondary | ICD-10-CM | POA: Insufficient documentation

## 2021-02-11 DIAGNOSIS — Z719 Counseling, unspecified: Secondary | ICD-10-CM | POA: Insufficient documentation

## 2021-02-11 DIAGNOSIS — Z0120 Encounter for dental examination and cleaning without abnormal findings: Secondary | ICD-10-CM | POA: Insufficient documentation

## 2021-02-11 DIAGNOSIS — F1010 Alcohol abuse, uncomplicated: Secondary | ICD-10-CM | POA: Insufficient documentation

## 2021-02-11 DIAGNOSIS — Z1211 Encounter for screening for malignant neoplasm of colon: Secondary | ICD-10-CM | POA: Insufficient documentation

## 2021-02-11 DIAGNOSIS — R29810 Facial weakness: Secondary | ICD-10-CM | POA: Insufficient documentation

## 2021-02-11 DIAGNOSIS — N39498 Other specified urinary incontinence: Secondary | ICD-10-CM | POA: Insufficient documentation

## 2021-02-11 DIAGNOSIS — M25559 Pain in unspecified hip: Secondary | ICD-10-CM | POA: Insufficient documentation

## 2021-02-11 DIAGNOSIS — R6889 Other general symptoms and signs: Secondary | ICD-10-CM | POA: Insufficient documentation

## 2021-02-11 DIAGNOSIS — H6120 Impacted cerumen, unspecified ear: Secondary | ICD-10-CM | POA: Insufficient documentation

## 2021-02-11 DIAGNOSIS — R2689 Other abnormalities of gait and mobility: Secondary | ICD-10-CM | POA: Insufficient documentation

## 2021-02-11 DIAGNOSIS — E113299 Type 2 diabetes mellitus with mild nonproliferative diabetic retinopathy without macular edema, unspecified eye: Secondary | ICD-10-CM | POA: Insufficient documentation

## 2021-02-11 DIAGNOSIS — G219 Secondary parkinsonism, unspecified: Secondary | ICD-10-CM | POA: Insufficient documentation

## 2021-02-11 DIAGNOSIS — Z7189 Other specified counseling: Secondary | ICD-10-CM | POA: Insufficient documentation

## 2021-02-11 DIAGNOSIS — Z7181 Spiritual or religious counseling: Secondary | ICD-10-CM | POA: Insufficient documentation

## 2021-02-11 DIAGNOSIS — K08131 Complete loss of teeth due to caries, class I: Secondary | ICD-10-CM | POA: Insufficient documentation

## 2021-02-11 DIAGNOSIS — E1165 Type 2 diabetes mellitus with hyperglycemia: Secondary | ICD-10-CM | POA: Insufficient documentation

## 2021-02-11 DIAGNOSIS — Z7689 Persons encountering health services in other specified circumstances: Secondary | ICD-10-CM | POA: Insufficient documentation

## 2021-02-11 DIAGNOSIS — K4090 Unilateral inguinal hernia, without obstruction or gangrene, not specified as recurrent: Secondary | ICD-10-CM | POA: Insufficient documentation

## 2021-02-11 DIAGNOSIS — E782 Mixed hyperlipidemia: Secondary | ICD-10-CM | POA: Insufficient documentation

## 2021-02-11 DIAGNOSIS — E7800 Pure hypercholesterolemia, unspecified: Secondary | ICD-10-CM | POA: Insufficient documentation

## 2021-02-11 DIAGNOSIS — K08539 Fractured dental restorative material, unspecified: Secondary | ICD-10-CM | POA: Insufficient documentation

## 2021-02-11 DIAGNOSIS — K5900 Constipation, unspecified: Secondary | ICD-10-CM | POA: Insufficient documentation

## 2021-02-11 DIAGNOSIS — G2581 Restless legs syndrome: Secondary | ICD-10-CM | POA: Insufficient documentation

## 2021-02-11 DIAGNOSIS — E300 Delayed puberty: Secondary | ICD-10-CM | POA: Insufficient documentation

## 2021-02-11 DIAGNOSIS — Z460 Encounter for fitting and adjustment of spectacles and contact lenses: Secondary | ICD-10-CM | POA: Insufficient documentation

## 2021-02-11 DIAGNOSIS — I69992 Facial weakness following unspecified cerebrovascular disease: Secondary | ICD-10-CM | POA: Insufficient documentation

## 2021-02-11 DIAGNOSIS — E785 Hyperlipidemia, unspecified: Secondary | ICD-10-CM | POA: Insufficient documentation

## 2021-02-11 DIAGNOSIS — Z5189 Encounter for other specified aftercare: Secondary | ICD-10-CM | POA: Insufficient documentation

## 2021-02-11 DIAGNOSIS — Z741 Need for assistance with personal care: Secondary | ICD-10-CM | POA: Insufficient documentation

## 2021-02-11 DIAGNOSIS — E119 Type 2 diabetes mellitus without complications: Secondary | ICD-10-CM | POA: Insufficient documentation

## 2021-02-11 DIAGNOSIS — F329 Major depressive disorder, single episode, unspecified: Secondary | ICD-10-CM | POA: Insufficient documentation

## 2021-02-11 DIAGNOSIS — L603 Nail dystrophy: Secondary | ICD-10-CM | POA: Insufficient documentation

## 2021-02-11 DIAGNOSIS — Z0000 Encounter for general adult medical examination without abnormal findings: Secondary | ICD-10-CM | POA: Insufficient documentation

## 2021-02-14 ENCOUNTER — Ambulatory Visit (INDEPENDENT_AMBULATORY_CARE_PROVIDER_SITE_OTHER): Payer: Medicare Other | Admitting: Emergency Medicine

## 2021-02-14 ENCOUNTER — Other Ambulatory Visit: Payer: Self-pay

## 2021-02-14 DIAGNOSIS — I63532 Cerebral infarction due to unspecified occlusion or stenosis of left posterior cerebral artery: Secondary | ICD-10-CM

## 2021-02-14 LAB — CUP PACEART INCLINIC DEVICE CHECK
Date Time Interrogation Session: 20220303130649
Implantable Pulse Generator Implant Date: 20220216

## 2021-02-14 NOTE — Progress Notes (Signed)
ILR wound check in clinic. Steri strips removed. Wound well healed. Home monitor transmitting nightly. No episodes. Questions answered. R waves measured 0.101mv.

## 2021-02-26 ENCOUNTER — Ambulatory Visit (INDEPENDENT_AMBULATORY_CARE_PROVIDER_SITE_OTHER): Payer: Medicare Other | Admitting: Endocrinology

## 2021-02-26 ENCOUNTER — Other Ambulatory Visit: Payer: Self-pay

## 2021-02-26 VITALS — BP 116/70 | HR 66 | Ht 68.0 in | Wt 184.2 lb

## 2021-02-26 DIAGNOSIS — Z794 Long term (current) use of insulin: Secondary | ICD-10-CM

## 2021-02-26 DIAGNOSIS — E11319 Type 2 diabetes mellitus with unspecified diabetic retinopathy without macular edema: Secondary | ICD-10-CM | POA: Diagnosis not present

## 2021-02-26 DIAGNOSIS — E119 Type 2 diabetes mellitus without complications: Secondary | ICD-10-CM | POA: Diagnosis not present

## 2021-02-26 DIAGNOSIS — I63532 Cerebral infarction due to unspecified occlusion or stenosis of left posterior cerebral artery: Secondary | ICD-10-CM | POA: Diagnosis not present

## 2021-02-26 LAB — POCT GLYCOSYLATED HEMOGLOBIN (HGB A1C): Hemoglobin A1C: 9 % — AB (ref 4.0–5.6)

## 2021-02-26 NOTE — Patient Instructions (Addendum)
check your blood sugar 4 times a day: before the 3 meals, and at bedtime.  also check if you have symptoms of your blood sugar being too high or too low.  please keep a record of the readings and bring it to your next appointment here (or you can bring the meter itself).  You can write it on any piece of paper.  please call us sooner if your blood sugar goes below 70, or if you have a lot of readings over 200. Please take these pump settings:  basal rate of 0.6 units/hr.  bolus of 1 unit/10 grams carbohydrate.   correction bolus (which some people call "sensitivity," or "insulin sensitivity ratio," or just "isr") of 1 unit for each 50 by which your glucose exceeds 100.   Please come back for a follow-up appointment in 2 months.

## 2021-02-26 NOTE — Progress Notes (Signed)
Subjective:    Patient ID: William Fitzpatrick, male    DOB: [birth year], 65 y.o.   MRN: [id number]  HPI Pt returns for f/u of diabetes mellitus: DM type: Insulin-requiring type 2 DM Dx'ed: 1994 Complications: DR Therapy: insulin since 2010 (pump rx since 2021) DKA: never Severe hypoglycemia: never Pancreatitis: never Pancreatic imaging: never SDOH: he did not get Rybelsus, due to cost. Other: he also took pump rx in 2020 Interval history: he takes these settings: basal rate of 0.6 units/hr. bolus of 1 unit/12 grams carbohydrate correction bolus (which some people call "sensitivity," or "insulin sensitivity ratio," or just "isr") of 1 unit for each 50 by which your glucose exceeds 100.  We are unable to access continuous glucose monitor data today.   TDD is 32 units (45% basal).  He takes 3.5 boluses per day.   He seldom has hypoglycemia, and these episodes are mild.    Past Medical History:  Diagnosis Date  . Schizophrenia (HCC)   . Stroke [facility])     Past Surgical History:  Procedure Laterality Date  . LOOP RECORDER INSERTION N/A 01/30/2021   Procedure: LOOP RECORDER INSERTION;  Surgeon: Marinus Maw, MD;  Location: [facility] INVASIVE CV LAB;  Service: Cardiovascular;  Laterality: N/A;    Social History   Socioeconomic History  . Marital status: Divorced    Spouse name: Not on file  . Number of children: Not on file  . Years of education: Not on file  . Highest education level: Not on file  Occupational History  . Not on file  Tobacco Use  . Smoking status: Current Every Day Smoker    Types: Cigarettes  . Smokeless tobacco: Never Used  Vaping Use  . Vaping Use: Never used  Substance and Sexual Activity  . Alcohol use: Yes  . Drug use: Not Currently  . Sexual activity: Not on file  Other Topics Concern  . Not on file  Social History Narrative  . Not on file   Social Determinants of Health   Financial Resource Strain: Not on file  Food Insecurity: No Food  Insecurity  . Worried About Programme researcher, broadcasting/film/video in the Last Year: Never true  . Ran Out of Food in the Last Year: Never true  Transportation Needs: No Transportation Needs  . Lack of Transportation (Medical): No  . Lack of Transportation (Non-Medical): No  Physical Activity: Not on file  Stress: Not on file  Social Connections: Not on file  Intimate Partner Violence: Not on file    Current Outpatient Medications on File Prior to Visit  Medication Sig Dispense Refill  . amLODipine (NORVASC) 10 MG tablet Take 1 tablet (10 mg total) by mouth daily. Restart on 2/18    . Apoaequorin (PREVAGEN PO) Take 1 capsule by mouth daily.    Marland Kitchen aspirin 81 MG EC tablet Take 1 tablet (81 mg total) by mouth daily. Swallow whole. 21 tablet 11  . clopidogrel (PLAVIX) 75 MG tablet Take 1 tablet (75 mg total) by mouth daily. 30 tablet 1  . Continuous Blood Gluc Sensor (DEXCOM G6 SENSOR) MISC 1 Device by Does not apply route See admin instructions. 10 each 3  . hydrochlorothiazide (MICROZIDE) 12.5 MG capsule Take 1 capsule (12.5 mg total) by mouth daily. Resume on 2/18    . insulin aspart (NOVOLOG) 100 UNIT/ML injection For use in pump, total of 60 units per day. 60 mL PRN  . levothyroxine (SYNTHROID) 75 MCG tablet Take 75 mcg by mouth daily  before breakfast.    . metFORMIN (GLUCOPHAGE-XR) 500 MG 24 hr tablet Take 1,000 mg by mouth 2 (two) times daily.    . nicotine (NICODERM CQ - DOSED IN MG/24 HOURS) 21 mg/24hr patch Place 1 patch (21 mg total) onto the skin daily. 28 patch 0  . OLANZapine (ZYPREXA) 20 MG tablet Take 10 mg by mouth at bedtime.    Marland Kitchen OVER THE COUNTER MEDICATION Take 1 tablet by mouth in the morning and at bedtime.    . rosuvastatin (CRESTOR) 20 MG tablet Take 1 tablet (20 mg total) by mouth daily. 30 tablet 0  . Semaglutide (RYBELSUS) 3 MG TABS Take 3 mg by mouth daily. 90 tablet 3   No current facility-administered medications on file prior to visit.    No Known Allergies  Family History   Problem Relation Age of Onset  . Diabetes Neg Hx   . Stroke Neg Hx     BP 116/70 (BP Location: Right Arm, Patient Position: Sitting, Cuff Size: Normal)   Pulse 66   Ht 5\' 8"  (1.727 m)   Wt 184 lb 3.2 oz (83.6 kg)   SpO2 97%   BMI 28.01 kg/m   Review of Systems He denies hypoglycemia    Objective:   Physical Exam VITAL SIGNS:  See vs page GENERAL: no distress Pulses: dorsalis pedis intact bilat.   MSK: no deformity of the feet CV: no leg edema Skin:  no ulcer on the feet.  normal color and temp on the feet. Neuro: sensation is intact to touch on the feet.    Lab Results  Component Value Date   HGBA1C 9.0 (A) 02/26/2021       Assessment & Plan:  Insulin-requiring type 2 DM, with DR: uncontrolled Hypoglycemia, due to insulin: this limits aggressiveness of glycemic control   Patient Instructions  check your blood sugar 4 times a day: before the 3 meals, and at bedtime.  also check if you have symptoms of your blood sugar being too high or too low.  please keep a record of the readings and bring it to your next appointment here (or you can bring the meter itself).  You can write it on any piece of paper.  please call 02/28/2021 sooner if your blood sugar goes below 70, or if you have a lot of readings over 200. Please take these pump settings:  basal rate of 0.6 units/hr.  bolus of 1 unit/10 grams carbohydrate.   correction bolus (which some people call "sensitivity," or "insulin sensitivity ratio," or just "isr") of 1 unit for each 50 by which your glucose exceeds 100.   Please come back for a follow-up appointment in 2 months.

## 2021-03-01 ENCOUNTER — Telehealth: Payer: Self-pay | Admitting: Endocrinology

## 2021-03-01 NOTE — Telephone Encounter (Signed)
Patient called re: Patient just spoke with his insurance company and requested the following:  MEDICATION: New RX  Authorized for Home Delivery for insulin aspart (NOVOLOG) 100 UNIT/ML injection   PHARMACY:  [facility] Ph# [phone]. Patient states he does not know Fax#  HAS THE PATIENT CONTACTED THEIR PHARMACY?  Yes  IS THIS A 90 DAY SUPPLY : Yes  IS PATIENT OUT OF MEDICATION: No  IF NOT; HOW MUCH IS LEFT: 1 Month   LAST APPOINTMENT DATE: @3 /15/2022  NEXT APPOINTMENT DATE:@5 /17/2022  DO WE HAVE YOUR PERMISSION TO LEAVE A DETAILED MESSAGE?: Yes  OTHER COMMENTS:    **Let patient know to contact pharmacy at the end of the day to make sure medication is ready. **  ** Please notify patient to allow 48-72 hours to process**  **Encourage patient to contact the pharmacy for refills or they can request refills through [facility]**

## 2021-03-04 ENCOUNTER — Other Ambulatory Visit: Payer: Self-pay

## 2021-03-04 DIAGNOSIS — Z794 Long term (current) use of insulin: Secondary | ICD-10-CM

## 2021-03-04 DIAGNOSIS — E11319 Type 2 diabetes mellitus with unspecified diabetic retinopathy without macular edema: Secondary | ICD-10-CM

## 2021-03-04 MED ORDER — INSULIN ASPART 100 UNIT/ML ~~LOC~~ SOLN: SUBCUTANEOUS | 99 refills | Status: DC

## 2021-03-05 ENCOUNTER — Ambulatory Visit (INDEPENDENT_AMBULATORY_CARE_PROVIDER_SITE_OTHER): Payer: Medicare Other | Admitting: Podiatry

## 2021-03-05 ENCOUNTER — Other Ambulatory Visit: Payer: Self-pay

## 2021-03-05 ENCOUNTER — Encounter: Payer: Self-pay | Admitting: Podiatry

## 2021-03-05 DIAGNOSIS — E1149 Type 2 diabetes mellitus with other diabetic neurological complication: Secondary | ICD-10-CM | POA: Diagnosis not present

## 2021-03-05 DIAGNOSIS — E1140 Type 2 diabetes mellitus with diabetic neuropathy, unspecified: Secondary | ICD-10-CM

## 2021-03-05 DIAGNOSIS — B351 Tinea unguium: Secondary | ICD-10-CM

## 2021-03-05 DIAGNOSIS — M79675 Pain in left toe(s): Secondary | ICD-10-CM

## 2021-03-05 DIAGNOSIS — M79674 Pain in right toe(s): Secondary | ICD-10-CM

## 2021-03-05 NOTE — Progress Notes (Signed)
This patient returns to my office for at risk foot care.  This patient requires this care by a professional since this patient will be at risk due to having diabetic neuropathy.  This patient is unable to cut nails himself since the patient cannot reach his nails.These nails are painful walking and wearing shoes.  This patient presents for at risk foot care today.  General Appearance  Alert, conversant and in no acute stress.  Vascular  Dorsalis pedis and posterior tibial  pulses are palpable  bilaterally.  Capillary return is within normal limits  bilaterally. Temperature is within normal limits  bilaterally.  Neurologic  Senn-Weinstein monofilament wire test within normal limits  bilaterally. Muscle power within normal limits bilaterally.  Nails Thick disfigured discolored nails with subungual debris  from hallux to fifth toes bilaterally. No evidence of bacterial infection or drainage bilaterally.  Orthopedic  No limitations of motion  feet .  No crepitus or effusions noted.  No bony pathology or digital deformities noted.  Skin  normotropic skin with no porokeratosis noted bilaterally.  No signs of infections or ulcers noted.     Onychomycosis  Pain in right toes  Pain in left toes  Consent was obtained for treatment procedures.   Mechanical debridement of nails 1-5  bilaterally performed with a nail nipper.  Filed with dremel without incident.    Return office visit   3 months                   Told patient to return for periodic foot care and evaluation due to potential at risk complications.   Richad Ramsay DPM   

## 2021-03-07 ENCOUNTER — Ambulatory Visit (INDEPENDENT_AMBULATORY_CARE_PROVIDER_SITE_OTHER): Payer: Medicare Other

## 2021-03-07 DIAGNOSIS — I63532 Cerebral infarction due to unspecified occlusion or stenosis of left posterior cerebral artery: Secondary | ICD-10-CM | POA: Diagnosis not present

## 2021-03-07 LAB — CUP PACEART REMOTE DEVICE CHECK
Date Time Interrogation Session: 20220324062244
Implantable Pulse Generator Implant Date: 20220216

## 2021-03-09 ENCOUNTER — Other Ambulatory Visit: Payer: Self-pay

## 2021-03-09 DIAGNOSIS — E11319 Type 2 diabetes mellitus with unspecified diabetic retinopathy without macular edema: Secondary | ICD-10-CM

## 2021-03-11 ENCOUNTER — Encounter: Payer: Medicare Other | Admitting: Nutrition

## 2021-03-19 NOTE — Progress Notes (Signed)
Carelink Summary Report / Loop Recorder 

## 2021-04-08 ENCOUNTER — Ambulatory Visit (INDEPENDENT_AMBULATORY_CARE_PROVIDER_SITE_OTHER): Payer: Medicare Other

## 2021-04-08 DIAGNOSIS — I63532 Cerebral infarction due to unspecified occlusion or stenosis of left posterior cerebral artery: Secondary | ICD-10-CM | POA: Diagnosis not present

## 2021-04-09 ENCOUNTER — Other Ambulatory Visit: Payer: Self-pay

## 2021-04-09 ENCOUNTER — Encounter (HOSPITAL_COMMUNITY): Payer: Self-pay

## 2021-04-09 ENCOUNTER — Emergency Department (HOSPITAL_COMMUNITY)
Admission: EM | Admit: 2021-04-09 | Discharge: 2021-04-12 | Payer: Medicare Other | Attending: Emergency Medicine | Admitting: Emergency Medicine

## 2021-04-09 DIAGNOSIS — E113299 Type 2 diabetes mellitus with mild nonproliferative diabetic retinopathy without macular edema, unspecified eye: Secondary | ICD-10-CM | POA: Insufficient documentation

## 2021-04-09 DIAGNOSIS — E1165 Type 2 diabetes mellitus with hyperglycemia: Secondary | ICD-10-CM | POA: Insufficient documentation

## 2021-04-09 DIAGNOSIS — Z794 Long term (current) use of insulin: Secondary | ICD-10-CM | POA: Insufficient documentation

## 2021-04-09 DIAGNOSIS — Z9114 Patient's other noncompliance with medication regimen: Secondary | ICD-10-CM | POA: Insufficient documentation

## 2021-04-09 DIAGNOSIS — E039 Hypothyroidism, unspecified: Secondary | ICD-10-CM | POA: Insufficient documentation

## 2021-04-09 DIAGNOSIS — R739 Hyperglycemia, unspecified: Secondary | ICD-10-CM

## 2021-04-09 DIAGNOSIS — F17210 Nicotine dependence, cigarettes, uncomplicated: Secondary | ICD-10-CM | POA: Diagnosis not present

## 2021-04-09 DIAGNOSIS — I169 Hypertensive crisis, unspecified: Secondary | ICD-10-CM | POA: Diagnosis not present

## 2021-04-09 DIAGNOSIS — F209 Schizophrenia, unspecified: Secondary | ICD-10-CM | POA: Insufficient documentation

## 2021-04-09 DIAGNOSIS — F29 Unspecified psychosis not due to a substance or known physiological condition: Secondary | ICD-10-CM | POA: Diagnosis not present

## 2021-04-09 DIAGNOSIS — Z79899 Other long term (current) drug therapy: Secondary | ICD-10-CM | POA: Insufficient documentation

## 2021-04-09 DIAGNOSIS — Z7984 Long term (current) use of oral hypoglycemic drugs: Secondary | ICD-10-CM | POA: Insufficient documentation

## 2021-04-09 DIAGNOSIS — Z20822 Contact with and (suspected) exposure to covid-19: Secondary | ICD-10-CM | POA: Diagnosis not present

## 2021-04-09 DIAGNOSIS — Z046 Encounter for general psychiatric examination, requested by authority: Secondary | ICD-10-CM | POA: Diagnosis present

## 2021-04-09 DIAGNOSIS — Z7982 Long term (current) use of aspirin: Secondary | ICD-10-CM | POA: Diagnosis not present

## 2021-04-09 LAB — ACETAMINOPHEN LEVEL: Acetaminophen (Tylenol), Serum: <10 ug/mL — ABNORMAL LOW (ref 10–30)

## 2021-04-09 LAB — CBC WITH DIFFERENTIAL/PLATELET
Abs Immature Granulocytes: 0.06 10*3/uL (ref 0.00–0.07)
Basophils Absolute: 0.1 10*3/uL (ref 0.0–0.1)
Basophils Relative: 1 %
Eosinophils Absolute: 0.1 10*3/uL (ref 0.0–0.5)
Eosinophils Relative: 2 %
HCT: 48 % (ref 39.0–52.0)
Hemoglobin: 16.8 g/dL (ref 13.0–17.0)
Immature Granulocytes: 1 %
Lymphocytes Relative: 29 %
Lymphs Abs: 2.2 10*3/uL (ref 0.7–4.0)
MCH: 31.8 pg (ref 26.0–34.0)
MCHC: 35 g/dL (ref 30.0–36.0)
MCV: 90.7 fL (ref 80.0–100.0)
Monocytes Absolute: 0.5 10*3/uL (ref 0.1–1.0)
Monocytes Relative: 7 %
Neutro Abs: 4.5 10*3/uL (ref 1.7–7.7)
Neutrophils Relative %: 60 %
Platelets: 179 10*3/uL (ref 150–400)
RBC: 5.29 MIL/uL (ref 4.22–5.81)
RDW: 12.2 % (ref 11.5–15.5)
WBC: 7.5 10*3/uL (ref 4.0–10.5)
nRBC: 0 % (ref 0.0–0.2)

## 2021-04-09 LAB — URINALYSIS, ROUTINE W REFLEX MICROSCOPIC
Bacteria, UA: NONE SEEN
Bilirubin Urine: NEGATIVE
Glucose, UA: >=500 mg/dL — AB
Hgb urine dipstick: NEGATIVE
Ketones, ur: NEGATIVE mg/dL
Leukocytes,Ua: NEGATIVE
Nitrite: NEGATIVE
Protein, ur: 30 mg/dL — AB
Specific Gravity, Urine: 1.012 (ref 1.005–1.030)
pH: 6 (ref 5.0–8.0)

## 2021-04-09 LAB — COMPREHENSIVE METABOLIC PANEL
ALT: 22 U/L (ref 0–44)
AST: 16 U/L (ref 15–41)
Albumin: 4.1 g/dL (ref 3.5–5.0)
Alkaline Phosphatase: 88 U/L (ref 38–126)
Anion gap: 10 (ref 5–15)
BUN: 17 mg/dL (ref 8–23)
CO2: 19 mmol/L — ABNORMAL LOW (ref 22–32)
Calcium: 9 mg/dL (ref 8.9–10.3)
Chloride: 102 mmol/L (ref 98–111)
Creatinine, Ser: 0.86 mg/dL (ref 0.61–1.24)
GFR, Estimated: >60 mL/min (ref >60)
Glucose, Bld: 342 mg/dL — ABNORMAL HIGH (ref 70–99)
Potassium: 3.6 mmol/L (ref 3.5–5.1)
Sodium: 131 mmol/L — ABNORMAL LOW (ref 135–145)
Total Bilirubin: 0.4 mg/dL (ref 0.3–1.2)
Total Protein: 7.1 g/dL (ref 6.5–8.1)

## 2021-04-09 LAB — CUP PACEART REMOTE DEVICE CHECK
Date Time Interrogation Session: 20220426062402
Implantable Pulse Generator Implant Date: 20220216

## 2021-04-09 LAB — ETHANOL: Alcohol, Ethyl (B): <10 mg/dL (ref <10)

## 2021-04-09 LAB — RAPID URINE DRUG SCREEN, HOSP PERFORMED
Amphetamines: NOT DETECTED
Barbiturates: NOT DETECTED
Benzodiazepines: NOT DETECTED
Cocaine: NOT DETECTED
Opiates: NOT DETECTED
Tetrahydrocannabinol: NOT DETECTED

## 2021-04-09 LAB — SALICYLATE LEVEL: Salicylate Lvl: <7 mg/dL — ABNORMAL LOW (ref 7.0–30.0)

## 2021-04-09 LAB — RESP PANEL BY RT-PCR (FLU A&B, COVID) ARPGX2
Influenza A by PCR: NEGATIVE
Influenza B by PCR: NEGATIVE
SARS Coronavirus 2 by RT PCR: NEGATIVE

## 2021-04-09 MED ORDER — LEVOTHYROXINE SODIUM 50 MCG PO TABS
75.0000 ug | ORAL_TABLET | Freq: Every day | ORAL | Status: DC
  Administered 2021-04-11 – 2021-04-12 (×2): 75 ug via ORAL
  Filled 2021-04-09 (×2): qty 1

## 2021-04-09 MED ORDER — NICOTINE 21 MG/24HR TD PT24
21.0000 mg | MEDICATED_PATCH | Freq: Every day | TRANSDERMAL | Status: DC
  Filled 2021-04-09: qty 1

## 2021-04-09 MED ORDER — ROSUVASTATIN CALCIUM 20 MG PO TABS
20.0000 mg | ORAL_TABLET | Freq: Every day | ORAL | Status: DC
  Administered 2021-04-11 – 2021-04-12 (×2): 20 mg via ORAL
  Filled 2021-04-09 (×3): qty 1

## 2021-04-09 MED ORDER — DIVALPROEX SODIUM ER 500 MG PO TB24
500.0000 mg | ORAL_TABLET | Freq: Every day | ORAL | Status: DC
  Administered 2021-04-11 – 2021-04-12 (×2): 500 mg via ORAL
  Filled 2021-04-09 (×2): qty 1

## 2021-04-09 MED ORDER — INSULIN ASPART 100 UNIT/ML ~~LOC~~ SOLN
0.0000 [IU] | Freq: Three times a day (TID) | SUBCUTANEOUS | Status: DC
  Administered 2021-04-11: 8 [IU] via SUBCUTANEOUS
  Administered 2021-04-11: 15 [IU] via SUBCUTANEOUS
  Administered 2021-04-11: 8 [IU] via SUBCUTANEOUS
  Administered 2021-04-12: 5 [IU] via SUBCUTANEOUS
  Administered 2021-04-12: 8 [IU] via SUBCUTANEOUS
  Administered 2021-04-12: 5 [IU] via SUBCUTANEOUS
  Filled 2021-04-09: qty 0.15

## 2021-04-09 MED ORDER — HYDROCHLOROTHIAZIDE 12.5 MG PO CAPS
12.5000 mg | ORAL_CAPSULE | Freq: Every day | ORAL | Status: DC
  Administered 2021-04-11 – 2021-04-12 (×2): 12.5 mg via ORAL
  Filled 2021-04-09 (×2): qty 1

## 2021-04-09 MED ORDER — OLANZAPINE 10 MG PO TABS
10.0000 mg | ORAL_TABLET | Freq: Every day | ORAL | Status: DC
  Administered 2021-04-12: 10 mg via ORAL
  Filled 2021-04-09 (×3): qty 1

## 2021-04-09 MED ORDER — METFORMIN HCL ER 500 MG PO TB24
1000.0000 mg | ORAL_TABLET | Freq: Two times a day (BID) | ORAL | Status: DC
  Administered 2021-04-11 – 2021-04-12 (×4): 1000 mg via ORAL
  Filled 2021-04-09 (×6): qty 2

## 2021-04-09 MED ORDER — CLOPIDOGREL BISULFATE 75 MG PO TABS
75.0000 mg | ORAL_TABLET | Freq: Every day | ORAL | Status: DC
  Administered 2021-04-12: 75 mg via ORAL
  Filled 2021-04-09 (×3): qty 1

## 2021-04-09 MED ORDER — SEMAGLUTIDE 3 MG PO TABS: 3.0000 mg | ORAL_TABLET | Freq: Every day | ORAL | Status: DC

## 2021-04-09 MED ORDER — INSULIN ASPART 100 UNIT/ML ~~LOC~~ SOLN
40.0000 [IU] | SUBCUTANEOUS | Status: DC
  Filled 2021-04-09: qty 0.5

## 2021-04-09 MED ORDER — ASPIRIN EC 81 MG PO TBEC
81.0000 mg | DELAYED_RELEASE_TABLET | Freq: Every day | ORAL | Status: DC
  Administered 2021-04-11 – 2021-04-12 (×2): 81 mg via ORAL
  Filled 2021-04-09 (×2): qty 1

## 2021-04-09 MED ORDER — AMLODIPINE BESYLATE 5 MG PO TABS
10.0000 mg | ORAL_TABLET | Freq: Every day | ORAL | Status: DC
  Administered 2021-04-11 – 2021-04-12 (×2): 10 mg via ORAL
  Filled 2021-04-09 (×2): qty 2

## 2021-04-09 NOTE — ED Provider Notes (Addendum)
[facility]-EMERGENCY DEPT Provider Note   CSN: [id number] Arrival date & time: 04/09/21  1518     History Chief Complaint  Patient presents with  . Psychiatric Evaluation    William Fitzpatrick is a 65 y.o. male.  HPI He was seen today by his psychiatric provider at the [facility] hospital, who placed him on involuntary commitment and sent him here for treatment.  She was concerned that he is not taking his diabetes medicine, insulin or metformin for 1 month and is therefore at risk of decompensation including DKA.  Patient states he is not taking any of his medications because he believes God will help him, and therefore medicines are not needed.  He states that God helped him twice this month, when he had symptoms of a stroke, and food poisoning.  He has a history of psychiatric illness.  He states that he has no problems and does not want to be here.  He is a poor historian.  Level 5 caveat-acute psychosis/delusional    Past Medical History:  Diagnosis Date  . Schizophrenia (HCC)   . Stroke [facility])     Patient Active Problem List   Diagnosis Date Noted  . Type 2 diabetes mellitus with hyperglycemia (HCC) 02/11/2021  . Vitamin D deficiency 02/11/2021  . Impacted cerumen, bilateral 02/11/2021  . Complete loss of teeth due to caries, class I 02/11/2021  . Abnormal involuntary movements 02/11/2021  . Alcohol abuse 02/11/2021  . Constipation 02/11/2021  . 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 Diabetes mellitus type 2 without retinopathy (HCC) 02/11/2021  . Disorder of bursae and tendons in shoulder region 02/11/2021  . Disorder of refraction and accommodation 02/11/2021  . Excessive cerumen in ear canal 02/11/2021  . 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 02/11/2021  . Hip pain 02/11/2021  . Inguinal hernia 02/11/2021  . Major depressive disorder, single episode 02/11/2021  . Mild nonproliferative diabetic  retinopathy (HCC) 02/11/2021  . Mixed hyperlipidemia 02/11/2021  . Obesity 02/11/2021  . 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 Pure hypercholesterolemia 02/11/2021  . Special screening for malignant neoplasms, colon 02/11/2021  . 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 Seborrheic dermatitis, unspecified 02/11/2021  . Restless legs syndrome 02/11/2021  . Need for assistance with personal care 02/11/2021  . Hyperlipidemia 02/11/2021  . Persons encountering health services in other specified circumstances 02/11/2021  . Counseling, unspecified 02/11/2021  . Fractured dental restorative material, unspecified 02/11/2021  . Nail dystrophy 02/11/2021  . Other specified urinary incontinence 02/11/2021  . Other abnormalities of gait and mobility 02/11/2021  . Other general symptoms and signs 02/11/2021  . Encounter for general adult medical examination w/o abnormal findings 02/11/2021  . Encounter for dental exam and cleaning w/o abnormal findings 02/11/2021  . Encounter for examination and observation for other specified reasons 02/11/2021  . Encounter for immunization 02/11/2021  . Fitting and adjustment of spectacles and contact lenses 02/11/2021  . Encounter for other specified aftercare 02/11/2021  . Spiritual or religious counseling 02/11/2021  . Other specified counseling 02/11/2021  . Nicotine dependence, other tobacco product, uncomplicated 01/29/2021  . Hypertensive crisis, unspecified 01/29/2021  . Hypothyroidism, unspecified 01/29/2021  . 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 Type 2 or unspecified type diabetes mellitus 10/27/2020  . Paranoid schizophrenia (HCC) 10/01/2019  . Suicidal thoughts     Past Surgical History:  Procedure Laterality Date  . LOOP RECORDER INSERTION N/A  01/30/2021   Procedure: LOOP RECORDER INSERTION;  Surgeon: Marinus Maw, MD;  Location: [facility] INVASIVE CV LAB;   Service: Cardiovascular;  Laterality: N/A;       Family History  Problem Relation Age of Onset  . Diabetes Neg Hx   . Stroke Neg Hx     Social History   Tobacco Use  . Smoking status: Current Every Day Smoker    Types: Cigarettes  . Smokeless tobacco: Never Used  Vaping Use  . Vaping Use: Never used  Substance Use Topics  . Alcohol use: Yes  . Drug use: Not Currently    Home Medications Prior to Admission medications   Medication Sig Start Date End Date Taking? Authorizing Provider  Apoaequorin (PREVAGEN PO) Take 1 capsule by mouth daily.   Yes [provider]  aspirin 81 MG EC tablet TAKE 1 TABLET (81 MG TOTAL) BY MOUTH DAILY. SWALLOW WHOLE. Patient taking differently: Take 81 mg by mouth daily. SWALLOW WHOLE. 01/30/21 01/30/22 Yes Calvert Cantor, MD  divalproex (DEPAKOTE ER) 500 MG 24 hr tablet Take 500 mg by mouth daily. 03/15/21  Yes [provider]  hydrochlorothiazide (MICROZIDE) 12.5 MG capsule Take 1 capsule (12.5 mg total) by mouth daily. Resume on 2/18 Patient taking differently: Take 12.5 mg by mouth daily. 01/30/21  Yes Calvert Cantor, MD  insulin aspart (NOVOLOG) 100 UNIT/ML injection For use in pump, total of 60 units per day. Patient taking differently: Inject 40-50 Units into the skin See admin instructions. For use in pump, total of 40-50units per day. 03/04/21  Yes Romero Belling, MD  INVEGA SUSTENNA 234 MG/1.5ML SUSY injection Inject 234 mg into the muscle every 30 (thirty) days. 04/01/21  Yes [provider]  metFORMIN (GLUCOPHAGE-XR) 500 MG 24 hr tablet Take 1,000 mg by mouth 2 (two) times daily. 08/14/20  Yes [provider]  OLANZapine (ZYPREXA) 20 MG tablet Take 10 mg by mouth at bedtime.   Yes [provider]  rosuvastatin (CRESTOR) 20 MG tablet TAKE 1 TABLET (20 MG TOTAL) BY MOUTH DAILY. Patient taking differently: Take 20 mg by mouth daily. 01/30/21 01/30/22 Yes Calvert Cantor, MD  amLODipine (NORVASC) 10 MG tablet Take  1 tablet (10 mg total) by mouth daily. Restart on 2/18 Patient taking differently: Take 10 mg by mouth daily. 01/30/21   Calvert Cantor, MD  aspirin 81 MG EC tablet Take 1 tablet (81 mg total) by mouth daily. Swallow whole. Patient not taking: No sig reported 01/31/21   Calvert Cantor, MD  clopidogrel (PLAVIX) 75 MG tablet Take 1 tablet (75 mg total) by mouth daily. Patient not taking: No sig reported 01/31/21   Calvert Cantor, MD  clopidogrel (PLAVIX) 75 MG tablet TAKE 1 TABLET (75 MG TOTAL) BY MOUTH DAILY. Patient not taking: No sig reported 01/30/21 01/30/22  Calvert Cantor, MD  Continuous Blood Gluc Sensor (DEXCOM G6 SENSOR) MISC 1 Device by Does not apply route See admin instructions. 11/12/20   Romero Belling, MD  levothyroxine (SYNTHROID) 75 MCG tablet Take 75 mcg by mouth daily before breakfast.    [provider]  nicotine (NICODERM CQ - DOSED IN MG/24 HOURS) 21 mg/24hr patch Place 1 patch (21 mg total) onto the skin daily. Patient not taking: Reported on 04/09/2021 01/31/21   Calvert Cantor, MD  nicotine (NICODERM CQ - DOSED IN MG/24 HOURS) 21 mg/24hr patch PLACE 1 PATCH (21 MG TOTAL) ONTO THE SKIN DAILY. Patient not taking: No sig reported 01/30/21 01/30/22  Calvert Cantor, MD  rosuvastatin (CRESTOR)  20 MG tablet Take 1 tablet (20 mg total) by mouth daily. Patient not taking: No sig reported 01/31/21   Calvert Cantorizwan, Saima, MD  Semaglutide (RYBELSUS) 3 MG TABS Take 3 mg by mouth daily. Patient not taking: Reported on 04/09/2021 12/27/20   Romero BellingEllison, Sean, MD    Allergies    Patient has no known allergies.  Review of Systems   Review of Systems  Unable to perform ROS: Other    Physical Exam Updated Vital Signs BP (!) 176/91 (BP Location: Right Arm)   Pulse 66   Temp 98.2 F (36.8 C) (Oral)   Resp 18   Ht 5\' 8"  (1.727 m)   Wt 83.9 kg   SpO2 97%   BMI 28.13 kg/m   Physical Exam Vitals and nursing note reviewed.  Constitutional:      General: He is not in acute distress.     Appearance: He is well-developed. He is not ill-appearing, toxic-appearing or diaphoretic.  HENT:     Head: Normocephalic and atraumatic.     Right Ear: External ear normal.     Left Ear: External ear normal.  Eyes:     Conjunctiva/sclera: Conjunctivae normal.     Pupils: Pupils are equal, round, and reactive to light.  Neck:     Trachea: Phonation normal.  Cardiovascular:     Rate and Rhythm: Normal rate and regular rhythm.     Heart sounds: Normal heart sounds.  Pulmonary:     Effort: Pulmonary effort is normal.     Breath sounds: Normal breath sounds.  Abdominal:     Palpations: Abdomen is soft.     Tenderness: There is no abdominal tenderness.  Musculoskeletal:        General: Normal range of motion.     Cervical back: Normal range of motion and neck supple.  Skin:    General: Skin is warm and dry.  Neurological:     Mental Status: He is alert and oriented to person, place, and time.     Cranial Nerves: No cranial nerve deficit.     Sensory: No sensory deficit.     Motor: No abnormal muscle tone.     Coordination: Coordination normal.  Psychiatric:        Attention and Perception: He is attentive.        Mood and Affect: Mood normal. Mood is not depressed.        Speech: He is communicative.        Behavior: Behavior normal.        Thought Content: Thought content is delusional.        Cognition and Memory: Cognition is impaired.        Judgment: Judgment is inappropriate.     ED Results / Procedures / Treatments   Labs (all labs ordered are listed, but only abnormal results are displayed) Labs Reviewed  COMPREHENSIVE METABOLIC PANEL - Abnormal; Notable for the following components:      Result Value   Sodium 131 (*)    CO2 19 (*)    Glucose, Bld 342 (*)    All other components within normal limits  SALICYLATE LEVEL - Abnormal; Notable for the following components:   Salicylate Lvl <7.0 (*)    All other components within normal limits  ACETAMINOPHEN LEVEL -  Abnormal; Notable for the following components:   Acetaminophen (Tylenol), Serum <10 (*)    All other components within normal limits  URINALYSIS, ROUTINE W REFLEX MICROSCOPIC - Abnormal; Notable for the  following components:   Color, Urine STRAW (*)    Glucose, UA >=500 (*)    Protein, ur 30 (*)    All other components within normal limits  RESP PANEL BY RT-PCR (FLU A&B, COVID) ARPGX2  ETHANOL  RAPID URINE DRUG SCREEN, HOSP PERFORMED  CBC WITH DIFFERENTIAL/PLATELET    EKG None  Radiology CUP PACEART REMOTE DEVICE CHECK  Result Date: 04/09/2021 ILR summary report received. Battery status OK. Normal device function. No new symptom, tachy, brady, or pause episodes. No new AF episodes. Monthly summary reports and ROV/PRN. LH   Procedures Procedures   Medications Ordered in ED Medications  Semaglutide TABS 3 mg (has no administration in time range)  rosuvastatin (CRESTOR) tablet 20 mg (has no administration in time range)  nicotine (NICODERM CQ - dosed in mg/24 hours) patch 21 mg (has no administration in time range)  aspirin EC tablet 81 mg (has no administration in time range)  clopidogrel (PLAVIX) tablet 75 mg (has no administration in time range)  amLODipine (NORVASC) tablet 10 mg (has no administration in time range)  divalproex (DEPAKOTE ER) 24 hr tablet 500 mg (has no administration in time range)  hydrochlorothiazide (MICROZIDE) capsule 12.5 mg (has no administration in time range)  insulin aspart (novoLOG) injection 40-50 Units (has no administration in time range)  levothyroxine (SYNTHROID) tablet 75 mcg (has no administration in time range)  metFORMIN (GLUCOPHAGE-XR) 24 hr tablet 1,000 mg (has no administration in time range)  OLANZapine (ZYPREXA) tablet 10 mg (has no administration in time range)    ED Course  I have reviewed the triage vital signs and the nursing notes.  Pertinent labs & imaging results that were available during my care of the patient were  reviewed by me and considered in my medical decision making (see chart for details).    MDM Rules/Calculators/A&P                           Patient Vitals for the past 24 hrs:  BP Temp Temp src Pulse Resp SpO2 Height Weight  04/09/21 2144 (!) 176/91 -- -- 66 18 97 % -- --  04/09/21 2005 (!) 165/90 -- -- 77 17 98 % -- --  04/09/21 1815 (!) 163/88 -- -- 73 16 96 % -- --  04/09/21 1707 (!) 163/88 -- -- 73 16 96 % -- --  04/09/21 1526 (!) 153/98 98.2 F (36.8 C) Oral 94 14 95 % 5\' 8"  (1.727 m) 83.9 kg      Medical Decision Making:  This patient is presenting for evaluation of psychosis and not taking medication, which does require a range of treatment options, and is a complaint that involves a high risk of morbidity and mortality. The differential diagnoses include metabolic instability, dehydration, occult illness. I decided to review old records, and in summary elderly male, not taking medications for diabetes presenting after seeing psychiatry today..  I did not require additional historical information from anyone.  Clinical Laboratory Tests Ordered, included CBC, Metabolic panel, Urinalysis and Salicylate level, Tylenol level, COVID testing. Review indicates normal except sodium low, CO2 low, glucose high glucose and protein in urine.  Normal anion gap normal.  UDS normal, viral panel normal.   Critical Interventions-clinical evaluation, laboratory testing, observation reassessment  After These Interventions, the Patient was reevaluated and was found to require hospitalization by psychiatry.  Medically cleared.  Usual medicines ordered.  The patient has been placed in psychiatric observation due to the  need to provide a safe environment for the patient while obtaining psychiatric consultation and evaluation, as well as ongoing medical and medication management to treat the patient's condition.  The patient has not been placed under full IVC at this time.   CRITICAL  CARE-no Performed by: Mancel Bale  Nursing Notes Reviewed/ Care Coordinated Applicable Imaging Reviewed Interpretation of Laboratory Data incorporated into ED treatment  TTS consultation  Disposition as per oncoming provider team in conjunction with TTS.    Final Clinical Impression(s) / ED Diagnoses Final diagnoses:  Psychosis, unspecified psychosis type (HCC)  Hyperglycemia  Noncompliance with medication regimen    Rx / DC Orders ED Discharge Orders    None       Mancel Bale, MD 04/09/21 1610    Mancel Bale, MD 04/09/21 [phone]

## 2021-04-09 NOTE — ED Notes (Signed)
Pt given sandwich and drink.

## 2021-04-09 NOTE — BH Assessment (Addendum)
BHH Assessment Progress Note  This pt presents under IVC initiated by a physician in the community.  This Clinical research associate has reviewed IVC documents and found several errors: 1) Date of birth as printed on Petition is erroneous.  The serving Hydrographic surveyor is aware of this and has been instructed to correct it by striking out the error, writing "Error," and penning in the correct date of birth; 2) Notary public has Nurse, children's, but signed in incorrect location; and 3) Examination is completed on outdated "Examination and Recommendation to Determine Necessity for Involuntary Commitment" form, rather than current "First Examination for Involuntary Commitment" form.  At 15:36 I called Chief Magistrate Chapman Moss to staff case with her.  She reports that since an Examination has been completed and Magistrate Grace Isaac has recognized the validity of the IVC by issuing a Findings and Custody Order, the process is valid.  This has been communicated to EDP Chaney Malling, MD and other pertinent parties.  Doylene Canning, [place] [facility] Coordinator [phone]

## 2021-04-09 NOTE — ED Notes (Signed)
Patient is resting in bed.

## 2021-04-09 NOTE — ED Triage Notes (Signed)
Brought in IVC by MD for pt no longer taking his medication. Patient states that god will take care of his medical issues and that he doesn't need his DM meds

## 2021-04-09 NOTE — ED Triage Notes (Signed)
Emergency Medicine Provider Triage Evaluation Note  William Fitzpatrick , a 65 y.o. male  was evaluated in triage.  Pt complains of brought in via [employer] under IVC taken out by Child psychotherapist.  Reportedly patient has a history of schizoaffective disorder and has not been taking his diabetes medications.  CBG taken on 4/21 with values of 406.  He has a history of hyperreligiosity and states that he does not need to take his diabetes medications as God will take care of this.  He reports feeling well.  No nausea, vomiting, abdominal pain.  Denies any SI or HI, no history of alcohol or drug use.  Review of Systems  Positive: As above Negative: As above  Physical Exam  BP (!) 153/98 (BP Location: Right Arm)   Pulse 94   Temp 98.2 F (36.8 C) (Oral)   Resp 14   Ht 5\' 8"  (1.727 m)   Wt 83.9 kg   SpO2 95%   BMI 28.13 kg/m  Gen:   Awake, no distress   HEENT:  Atraumatic  Resp:  Normal effort  Cardiac:  Normal rate  Abd:   Nondistended, nontender  MSK:   Moves extremities without difficulty  Neuro:  Speech clear   Medical Decision Making  Medically screening exam initiated at 3:42 PM.  Appropriate orders placed.  Ethridge Sollenberger was informed that the remainder of the evaluation will be completed by another provider, this initial triage assessment does not replace that evaluation, and the importance of remaining in the ED until their evaluation is complete.  Clinical Impression  Stable for provider evaluation   [name], PA-C 04/09/21 1544

## 2021-04-09 NOTE — ED Notes (Signed)
Patient is refusing to take medications.

## 2021-04-10 ENCOUNTER — Emergency Department (HOSPITAL_COMMUNITY): Payer: Medicare Other

## 2021-04-10 ENCOUNTER — Other Ambulatory Visit: Payer: Self-pay

## 2021-04-10 DIAGNOSIS — F29 Unspecified psychosis not due to a substance or known physiological condition: Secondary | ICD-10-CM | POA: Diagnosis not present

## 2021-04-10 LAB — CBG MONITORING, ED: Glucose-Capillary: 376 mg/dL — ABNORMAL HIGH (ref 70–99)

## 2021-04-10 NOTE — ED Notes (Signed)
Patient being seen by TTS

## 2021-04-10 NOTE — ED Notes (Signed)
Went to check patients blood sugar. I introduced myself and explained what I was there to do. The patient said "nope I am not doing that. I will cooperate when I see the doctor".

## 2021-04-10 NOTE — ED Notes (Signed)
Spoke to patient about his blood pressure being elevated. Patient stated he knew his blood pressure was high and has been for a month. Patient also states he has not taken any of his medication ina month because " I am waiting on Jesus Christ to tell me. He is testing me."  Patient is amendable to discussing medication, but not willing to take any medications. Patient stated he would eat any food given to help his blood pressure. I stated that food would not fix his blood pressure or blood sugar.

## 2021-04-10 NOTE — ED Notes (Signed)
Patient ambulated to the bathroom.

## 2021-04-10 NOTE — ED Notes (Signed)
Pt refused chest xray until "I talk to the doctor". Dr. Rhunette Croft made aware.

## 2021-04-10 NOTE — ED Notes (Signed)
Pt refused VS  

## 2021-04-10 NOTE — BH Assessment (Signed)
[facility] Assessment Progress Note  Per Liborio Nixon, NP, this pt requires psychiatric hospitalization at this time.  Pt presents under IVC initiated by physicians in the community, including one associated with the [facility].  At 10:54 I called the [facility] and left a message for April.  She called back later to report that their facility has a very long wait list at this time, and that the [place] is under mental health diversion.  The following facilities have been contacted to seek placement for this pt, with results as noted:  Beds available, information sent, decision pending: [facility] ([place] and [place]) [facility]   At capacity: [facility] (unit is currently closed) [place]  Doylene Canning, [place] Behavior[facility] Health Coordinator [phone]

## 2021-04-10 NOTE — ED Notes (Signed)
PT REFUSED VITALS STATES UNTIL HE SEE'S A DOCTOR HE WILL NOT LET ANYONE GET HIS VITALS.

## 2021-04-10 NOTE — BH Assessment (Signed)
Comprehensive Clinical Assessment (CCA) Note  04/10/2021 William Fitzpatrick [id number]   Disposition:  Per William Nixon, NP, patient is recommended for inpatient treatment.  The patient demonstrates the following risk factors for suicide: Chronic risk factors for suicide include: Patient has no previous history of suicidal thoughts or attempts, but does have a history of psychosis. Acute risk factors for suicide include: Patient has no acute risk for suicide, but his delusional thinking has caused his to discontinue life sustaining medications.. Protective factors for this patient include: Patient is actively engaged in treatment at the [place]. Considering these factors, the overall suicide risk at this point appears to be low. Patient is not appropriate for outpatient follow up due to his current delusional thinking.  PHQ2-9   Flowsheet Row ED from 04/09/2021 in [facility] Patient Outreach Telephone from 02/07/2021 in [facility] Community Care Coordination  PHQ-2 Total Score 0 0  PHQ-9 Total Score 3 --    Flowsheet Row ED from 04/09/2021 in [facility] ED to Hosp-Admission (Discharged) from 01/28/2021 in [facility] from 10/01/2019 in [facility]  C-SSRS RISK CATEGORY No Risk No Risk High Risk     Patient presents to [facility] on IVC petitioned by the [facility] because they were concerned about him.  Patient states that he is [employer] and states that he has not taken his diabetes medication in the past month because he states that God will take care of his son.  Patient states that he was diagnosed with schizophrenia when he was in the [employer] and states that he has been dealing with this condition for the past 34 years.  Patient states that he is not suicidal, homicidal, but states that he is William.  Patient states, "Bring me someone to heal and we can take care of this  in less than five minutes."  Patient states that he sleeps five hours per night and his sleep is broken.  He states that his appetite is good.  Patient denies any history of abuse or self mutilation. Patient states that he is divorced and lives alone.  He states that he has three grown children.  He states that he was in [employer] for 12 years until he was diagnosed with schizophrenia.  He states that he worked for [employer] once he left the [employer].  Patient does not feel like he needs to be in the hospital because he is not trying to purposely hurt himself.  Patient is alert and oriented x 3, but thinks that he is [employer].  His judgment, insight and impulse control are impaired.  He does not appear to be responding to any internal stimuli, but he is delusional  His memory is intact and his thoughts are organized. Patient is pleasant and cooperative    Chief Complaint:  Chief Complaint  Patient presents with  . Psychiatric Evaluation  . Schizophrenia   Visit Diagnosis: Schizophrenia F20   CCA Screening, Triage and Referral (STR)  Patient Reported Information [facility] about [place]  Referral name: Patient was placed on IVC by the [facility]  Referral phone number: No data recorded  Whom do you see for routine medical problems? Primary Care  Practice/Facility Name: [facility]  Practice/Facility Phone Number: No data recorded Name of Contact: No data recorded Contact Number: No data recorded Contact Fax Number: No data recorded Prescriber Name: No data recorded Prescriber Address (if  known): No data recorded  What Is the Reason for Your Visit/Call Today? Patient thinks he is [employer] Fitzpatrick, not taking his diabetes medication saying that God will take care of him  How Long Has This Been Causing You Problems? > than 6 months  What Do You Feel Would Help You the Most Today? -- (patient does not feel like he needs treatment)   Have You Recently Been in Any Inpatient  Treatment (Hospital/Detox/Crisis Center/28-Day Program)? No  Name/Location of Program/Hospital:No data recorded How Long Were You There? No data recorded When Were You Discharged? No data recorded  Have You Ever Received Services From [facility] Before? Yes  Who Do You See at [facility]? Patient has been seen in the ED in the past   Have You Recently Had Any Thoughts About Hurting Yourself? No  Are You Planning to Commit Suicide/Harm Yourself At This time? No   Have you Recently Had Thoughts About Hurting Someone William [place]slse? No  Explanation: No data recorded  Have You Used Any Alcohol or Drugs in the Past 24 Hours? No  How Long Ago Did You Use Drugs or Alcohol? No data recorded What Did You Use and How Much? No data recorded  Do You Currently Have a Therapist/Psychiatrist? Yes  Name of Therapist/Psychiatrist: [facility]   Have You Been Recently Discharged From Any Office Practice or Programs? No  Explanation of Discharge From Practice/Program: No data recorded    CCA Screening Triage Referral Assessment Type of Contact: Tele-Assessment  Is this Initial or Reassessment? Initial Assessment  Date Telepsych consult ordered in [facility]:  04/09/2021  [facility]  2329   Patient Reported Information Reviewed? Yes  Patient Left Without Being Seen? No data recorded Reason for Not Completing Assessment: No data recorded  Collateral Involvement: none available   Does Patient Have a Court Appointed Legal Guardian? No data recorded Name and Contact of Legal Guardian: No data recorded If Minor and Not Living with Parent(s), Who has Custody? No data recorded Is CPS involved or ever been involved? Never  Is APS involved or ever been involved? Never   Patient Determined To Be At Risk for Harm To Self or Others Based on Review of Patient Reported Information or Presenting Complaint? No  Method: No data recorded Availability of  Means: No data recorded Intent: No data recorded Notification Required: No data recorded Additional Information for Danger to Others Potential: No data recorded Additional Comments for Danger to Others Potential: No data recorded Are There Guns or Other Weapons in Your Home? No data recorded Types of Guns/Weapons: No data recorded Are These Weapons Safely Secured?                            No data recorded Who Could Verify You Are Able To Have These Secured: No data recorded Do You Have any Outstanding Charges, Pending Court Dates, Parole/Probation? No data recorded Contacted To Inform of Risk of Harm To Self or Others: Other: Comment (none available, patient is being admitted to hospital)   Location of Assessment: [facility] ED   Does Patient Present under Involuntary Commitment? Yes  IVC Papers Initial File Date: 04/09/2021   [place]   Patient Currently Receiving the Following Services: Medication Management   Determination of Need: Emergent (2 hours)   Options For Referral: Inpatient Hospitalization     CCA Biopsychosocial Intake/Chief Complaint:  Patient presents  to [facility] on IVC petitioned by the [facility] because they were concerned about him.  Patient states that he is [employer] and states that he has not taken his diabetes medication in the past month because he states that God will take care of his son.  Patient states that he was diagnosed with schizophrenia when he was in the [employer] and states that he has been dealing with this condition for the past 34 years.  Patient states that he is not suicidal, homicidal, but states that he is William.  Patient states, "Bring me someone to heal and we can take care of this in less than five minutes."  Patient states that he sleeps five hours per night and his sleep is broken.  He states that his appetite is good.  Patient denies any history of abuse or self mutilation. Patient states that he is divorced and lives  alone.  He states that he has three grown children.  He states that he was in [employer] for 12 years until he was diagnosed with schizophrenia.  He states that he worked for [employer] once he left the [employer].  Patient does not feel like he needs to be in the hospital because he is not trying to purposely hurt himself.  Current Symptoms/Problems: Patien is presently delusional   Patient Reported Schizophrenia/Schizoaffective Diagnosis in Past: Yes   Strengths: Patient states, "I am William Fitzpatrick"  Preferences: patient has no special needs that require accommodation  Abilities: Patient states that he is good at baseball   Type of Services Patient Feels are Needed: Patient does not feel like he needs inpatient treatment   Initial Clinical Notes/Concerns: No data recorded  Mental Health Symptoms Depression:  Sleep (too much or little)   Duration of Depressive symptoms: Greater than two weeks   Mania:  None   Anxiety:   None   Psychosis:  Delusions   Duration of Psychotic symptoms: Less than six months   Trauma:  None   Obsessions:  None   Compulsions:  None   Inattention:  None   Hyperactivity/Impulsivity:  N/A   Oppositional/Defiant Behaviors:  None   Emotional Irregularity:  None   Other Mood/Personality Symptoms:  No data recorded   Mental Status Exam Appearance and self-care  Stature:  Average   Weight:  Average weight   Clothing:  Casual; Neat/clean   Grooming:  Normal   Cosmetic use:  None   Posture/gait:  Normal   Motor activity:  Not Remarkable   Sensorium  Attention:  Normal   Concentration:  Normal   Orientation:  Object; Place; Situation; Time (Patient thinks he is Copywriter, advertising)   Recall/memory:  Normal   Affect and Mood  Affect:  Appropriate   Mood:  Other (Comment) (appropriate)   Relating  Eye contact:  Normal   Facial expression:  Responsive   Attitude toward examiner:  Cooperative   Thought and Language  Speech flow:  Clear and Coherent   Thought content:  Appropriate to Mood and Circumstances   Preoccupation:  None   Hallucinations:  None   Organization:  No data recorded  [employer] of Knowledge:  Good   Intelligence:  Above Average   Abstraction:  Normal   Judgement:  Impaired   Reality Testing:  Distorted   Insight:  Lacking   Decision Making:  Impulsive   Social Functioning  Social Maturity:  Responsible   Social Judgement:  Normal   Stress  Stressors:  Other (Comment) (none reported)   Coping Ability:  Normal   Skill Deficits:  Decision making   Supports:  Family     Religion: Religion/Spirituality Are You A Religious Person?:  (not assessed) How Might This Affect Treatment?: N/A  Leisure/Recreation: Leisure / Recreation Do You Have Hobbies?: Yes Leisure and Hobbies: baseball  Exercise/Diet: Exercise/Diet Do You Exercise?: No Have You Gained or Lost A Significant Amount of Weight in the Past Six Months?: No Do You Follow a Special Diet?: No Do You Have Any Trouble Sleeping?: Yes Explanation of Sleeping Difficulties: 4 hrs of broken sleep each night   CCA Employment/Education Employment/Work Situation: Employment / Work Psychologist, occupational Employment situation: Retired Psychologist, clinical job has been impacted by current illness: No What is the longest time patient has a held a job?: 12 years in the [employer] and 7 years at [employer] Has patient ever been in the [employer]?: Yes (Describe in Veterinary surgeon) Scientist, research (life sciences))  Education: Education Is Patient Currently Attending School?: No Last Grade Completed: 12 Name of 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[employer], Public relations account executive [employer] Did Garment/textile technologist From [employer]?: Yes Did Theme park manager?: Yes What Type of College Degree Do you Have?: [employer] Did [place] Attend Graduate School?: No Did You Have An Individualized Education Program (IIEP): No Did You Have Any Difficulty At [employer]?: No Patient's Education Has Been Impacted by  Current Illness: No   CCA Family/Childhood History Family and Relationship History: Family history Marital status: Divorced Divorced, when?: not assessed What types of issues is patient dealing with in the relationship?: N/A Are you sexually active?: No What is your sexual orientation?: heterosexual Does patient have children?: Yes How many children?: 3 How is patient's relationship with their children?: Patient states that he has a good relationship with his children  Childhood History:  Childhood History By whom was/is the patient raised?: Both parents Description of patient's relationship with caregiver when they were a child: Patient states that he was close to his parents growing up Patient's description of current relationship with people who raised him/her: Patient's parents are deceased How were you disciplined when you got in trouble as a child/adolescent?: not assessed Does patient have siblings?:  (not assessed) Did patient suffer any verbal/emotional/physical/sexual abuse as a child?: No Did patient suffer from severe childhood neglect?: No Has patient ever been sexually abused/assaulted/raped as an adolescent or adult?: No Was the patient ever a victim of a crime or a disaster?: No Witnessed domestic violence?: No Has patient been affected by domestic violence as an adult?: No  Child/Adolescent Assessment:     CCA Substance Use Alcohol/Drug Use: Alcohol / Drug Use Pain Medications: See MAR Prescriptions: See MAR Over the Counter: See MAR History of alcohol / drug use?: No history of alcohol / drug abuse Longest period of sobriety (when/how long): N/A                         ASAM's:  Six Dimensions of Multidimensional Assessment  Dimension 1:  Acute Intoxication and/or Withdrawal Potential:      Dimension 2:  Biomedical Conditions and Complications:      Dimension 3:  Emotional, Behavioral, or Cognitive Conditions and Complications:      Dimension 4:  Readiness to Change:     Dimension 5:  Relapse, Continued use, or Continued Problem Potential:     Dimension 6:  Recovery/Living Environment:     ASAM Severity Score:    ASAM Recommended Level of Treatment:  Substance use Disorder (SUD)    Recommendations for Services/Supports/Treatments:    DSM5 Diagnoses: Patient Active Problem List   Diagnosis Date Noted  . Type 2 diabetes mellitus with hyperglycemia (HCC) 02/11/2021  . Vitamin D deficiency 02/11/2021  . Impacted cerumen, bilateral 02/11/2021  . Complete loss of teeth due to caries, class I 02/11/2021  . Abnormal involuntary movements 02/11/2021  . Alcohol abuse 02/11/2021  . Constipation 02/11/2021  . 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 Diabetes mellitus type 2 without retinopathy (HCC) 02/11/2021  . Disorder of bursae and tendons in shoulder region 02/11/2021  . Disorder of refraction and accommodation 02/11/2021  . Excessive cerumen in ear canal 02/11/2021  . 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 02/11/2021  . Hip pain 02/11/2021  . Inguinal hernia 02/11/2021  . Major depressive disorder, single episode 02/11/2021  . Mild nonproliferative diabetic retinopathy (HCC) 02/11/2021  . Mixed hyperlipidemia 02/11/2021  . Obesity 02/11/2021  . 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 Pure hypercholesterolemia 02/11/2021  . Special screening for malignant neoplasms, colon 02/11/2021  . 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 Seborrheic dermatitis, unspecified 02/11/2021  . Restless legs syndrome 02/11/2021  . Need for assistance with personal care 02/11/2021  . Hyperlipidemia 02/11/2021  . Persons encountering health services in other specified circumstances 02/11/2021  . Counseling, unspecified 02/11/2021  . Fractured dental restorative material, unspecified 02/11/2021  . Nail dystrophy 02/11/2021  . Other specified urinary incontinence  02/11/2021  . Other abnormalities of gait and mobility 02/11/2021  . Other general symptoms and signs 02/11/2021  . Encounter for general adult medical examination w/o abnormal findings 02/11/2021  . Encounter for dental exam and cleaning w/o abnormal findings 02/11/2021  . Encounter for examination and observation for other specified reasons 02/11/2021  . Encounter for immunization 02/11/2021  . Fitting and adjustment of spectacles and contact lenses 02/11/2021  . Encounter for other specified aftercare 02/11/2021  . Spiritual or religious counseling 02/11/2021  . Other specified counseling 02/11/2021  . Nicotine dependence, other tobacco product, uncomplicated 01/29/2021  . Hypertensive crisis, unspecified 01/29/2021  . Hypothyroidism, unspecified 01/29/2021  . 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 Type 2 or unspecified type diabetes mellitus 10/27/2020  . Paranoid schizophrenia (HCC) 10/01/2019  . Suicidal thoughts       Referrals to Alternative Service(s): Referred to Alternative Service(s):   Place:   Date:   Time:    Referred to Alternative Service(s):   Place:   Date:   Time:    Referred to Alternative Service(s):   Place:   Date:   Time:    Referred to Alternative Service(s):   Place:   Date:   Time:     Heavenly Christine J Eveny Anastas, LCAS

## 2021-04-10 NOTE — ED Notes (Addendum)
This RN asked patient if he would like to take his morning medications. Patient refused to take any medications and refused to have his blood sugar taken, despite discussion regarding elevated blood pressure and blood sugar. Patient is resting in bed and is otherwise cooperative.

## 2021-04-10 NOTE — ED Notes (Signed)
Patient is laying in bed, resting, not asleep.

## 2021-04-10 NOTE — Progress Notes (Signed)
Inpatient Diabetes Program Recommendations  AACE/ADA: New Consensus Statement on Inpatient Glycemic Control (2015)  Target Ranges:  Prepandial:   less than 140 mg/dL      Peak postprandial:   less than 180 mg/dL (1-2 hours)      Critically ill patients:  140 - 180 mg/dL   Lab Results  Component Value Date   GLUCAP 376 (H) 04/09/2021   HGBA1C 9.0 (A) 02/26/2021    Review of Glycemic Control  Diabetes history: DM2 Outpatient Diabetes medications: metformin 1000 mg bid, Novolog 40-50 units /day in pump, Rybelsus 3 mg QD Current orders for Inpatient glycemic control: Novolog 0-15 units TID with meals, metformin 1000 mg BID, semaglutide 3 mg QD  HgbA1C - 9.0% Endo - [place] 4/26 CBGs: 342, 376 mg/dL  Inpatient Diabetes Program Recommendations:     Pt is refusing meds at present. No CBGs today.  Will likely need small amount of basal insulin since pump setting for basal is 0.6/H which equals 14.4 units/day  When appropriate consider adding Lantus 10 units Q24H.  Will follow.  Thank you. Ailene Ards, RD, LDN, CDE Inpatient Diabetes Coordinator [phone]

## 2021-04-10 NOTE — ED Notes (Signed)
Pt refused to have vitals taken  informed the nurse

## 2021-04-10 NOTE — ED Notes (Signed)
Patient was given his lunch tray.

## 2021-04-10 NOTE — ED Notes (Signed)
TSS call complete. Patient ambulated to the bathroom.

## 2021-04-10 NOTE — ED Notes (Signed)
Pt is laying in the edge of the bed. He states it is more comfortable.

## 2021-04-11 ENCOUNTER — Emergency Department (HOSPITAL_COMMUNITY): Payer: Medicare Other

## 2021-04-11 DIAGNOSIS — F29 Unspecified psychosis not due to a substance or known physiological condition: Secondary | ICD-10-CM | POA: Diagnosis not present

## 2021-04-11 LAB — CBG MONITORING, ED
Glucose-Capillary: 357 mg/dL — ABNORMAL HIGH (ref 70–99)
Glucose-Capillary: 295 mg/dL — ABNORMAL HIGH (ref 70–99)
Glucose-Capillary: 261 mg/dL — ABNORMAL HIGH (ref 70–99)

## 2021-04-11 NOTE — ED Notes (Signed)
Vitals refused.

## 2021-04-11 NOTE — ED Notes (Signed)
Pt refused to take his medicine and get his Vital Signs again this morning.

## 2021-04-11 NOTE — BH Assessment (Addendum)
BHH Assessment Progress Note  Per Liborio Nixon, NP, this pt continues to require psychiatric hospitalization at a facility able to attend to his geriatric care needs at this time.  The following facilities have been contacted to seek placement for this pt, with results as noted:  Beds available, information sent, decision pending: [facility] (resubmitted) [facility]  Unable to reach: [facility] (regarding 4/27 referral; left message at 11:10) [facility] (regarding 4/27 referral; left message at 11:21)  Declined: [facility] (due to uncontrolled diabetes; willing to reconsider in 24 hour, but parameters are unspecified) [facility] (due to CBG's)  At capacity: [facility] (unit currently closed) [facility]   Doylene Canning, [place] Behavioral Health Coordinator [phone]

## 2021-04-11 NOTE — ED Notes (Signed)
Patient refused for me to get his vital signs and blood sugar until he speaks with a doctor.

## 2021-04-11 NOTE — Progress Notes (Signed)
04/11/2021  [id number]  Notified MD that patient is refusing v/s and blood sugar checks until he speaks with a doctor. Asked MD to come see patient.

## 2021-04-11 NOTE — Care Management (Signed)
[facility] requesting new vitals in the morning for possible consideration.of inpatient hospitalization.

## 2021-04-12 DIAGNOSIS — F29 Unspecified psychosis not due to a substance or known physiological condition: Secondary | ICD-10-CM | POA: Diagnosis not present

## 2021-04-12 LAB — CBG MONITORING, ED
Glucose-Capillary: 259 mg/dL — ABNORMAL HIGH (ref 70–99)
Glucose-Capillary: 276 mg/dL — ABNORMAL HIGH (ref 70–99)
Glucose-Capillary: 213 mg/dL — ABNORMAL HIGH (ref 70–99)
Glucose-Capillary: 205 mg/dL — ABNORMAL HIGH (ref 70–99)

## 2021-04-12 LAB — RESP PANEL BY RT-PCR (FLU A&B, COVID) ARPGX2
Influenza A by PCR: NEGATIVE
Influenza B by PCR: NEGATIVE
SARS Coronavirus 2 by RT PCR: NEGATIVE

## 2021-04-12 NOTE — ED Notes (Signed)
Pt cont sleeping in bed, wakes easily to tactile stimuli. Woke briefly and back to sleep. NAD.

## 2021-04-12 NOTE — ED Notes (Signed)
Gave report to [place] at the [facility], message left with [facility] dept. for transport

## 2021-04-12 NOTE — ED Notes (Signed)
Reminded tech to check the vitals

## 2021-04-12 NOTE — ED Provider Notes (Signed)
Emergency Medicine Observation Re-evaluation Note  William Fitzpatrick is a 65 y.o. male, seen on rounds today.  Pt initially presented to the ED for complaints of Psychiatric Evaluation and Schizophrenia Currently, the patient is resting.  Physical Exam  BP (!) 142/87 (BP Location: Right Arm)   Pulse 62   Temp 98.6 F (37 C) (Oral)   Resp 16   Ht 5\' 8"  (1.727 m)   Wt 83.9 kg   SpO2 96%   BMI 28.13 kg/m  Physical Exam General: Resting in bed Cardiac: Warm and well-perfused Lungs: Even unlabored, no respiratory distress Psych: Calm and cooperative  ED Course / MDM  EKG:EKG Interpretation  Date/Time:  Tuesday April 09 2021 16:18:32 EDT Ventricular Rate:  80 PR Interval:  146 QRS Duration: 88 QT Interval:  386 QTC Calculation: 446 R Axis:   227 Text Interpretation: T wave flattening Sinus rhythm Confirmed by 01-26-1973 [phone]) on 04/10/2021 7:17:00 PM   I have reviewed the labs performed to date as well as medications administered while in observation.  Recent changes in the last 24 hours include no acute changes, psych continues to rec in pt.  Plan  Current plan is for in pt placement. Patient is under full IVC at this time.   04/12/2021, MD 04/12/21 1007

## 2021-04-12 NOTE — ED Notes (Signed)
Spoke wit officer r/t transport, still going to be a while and he will call facility for updates on ETA.

## 2021-04-12 NOTE — BH Assessment (Signed)
BHH Assessment Progress Note  This Clinical research associate has spoken to intake department at [facility] this morning.  They report that they cannot give pt further consideration unless [facility].  This Clinical research associate placed calls to [place] at 10:00, 10:21 and 11:37; reached [place] on final call.  She reports that they currently have beds available, but will need repeat Covid test before they can go further.  EDP Marianna Fuss, MD has been notified.  Final disposition is pending as of this writing.  Doylene Canning, [place] Behavioral Health Coordinator [phone]

## 2021-04-12 NOTE — ED Notes (Signed)
Spoke with [employer]. Pt is on the list for transport, unknown ETA

## 2021-04-12 NOTE — BH Assessment (Signed)
[facility] Assessment Progress Note  Per Liborio Nixon, NP, this involuntary pt requires psychiatric hospitalization at this time.  At 15:30 Victorino Dike calls from [facility] to report that pt has been accepted to their facility by Sherol Dade, NP.  Maxie Barb, NP concurs with this decision.  EDP Chaney Malling, MD and pt's nurse, Dawn, have been notified, and Dawn agrees to call report to [phone] or [phone].  Pt is to be transported via [facility].  Pt is to be taken to Building [facility] for internal Covid testing.  From there he will go to [address], [address].  D[facility] Coordinator [phone]

## 2021-04-12 NOTE — ED Notes (Signed)
Compliant with medication administration and ADL's. Requested his insulin with breakfast.

## 2021-04-15 ENCOUNTER — Telehealth: Payer: Self-pay | Admitting: Endocrinology

## 2021-04-19 NOTE — Telephone Encounter (Signed)
Patient called needs the Novolog refill currently because he had "a psychiatric event and it caused him to throw out all of his medications and be hospitalized @ an in patient facility"  Paint does confirm that his pharmacy now is [facility].   Call back # [phone] (please note patient has not accessed MyChart since 01/14/2021 - calling seems to be the best way to reach this patient)

## 2021-04-26 NOTE — Progress Notes (Signed)
Carelink Summary Report / Loop Recorder 

## 2021-04-29 NOTE — Telephone Encounter (Signed)
Pump scheduled for 5/24 at 8 AM

## 2021-04-30 ENCOUNTER — Other Ambulatory Visit: Payer: Self-pay

## 2021-04-30 ENCOUNTER — Ambulatory Visit (INDEPENDENT_AMBULATORY_CARE_PROVIDER_SITE_OTHER): Payer: Medicare Other | Admitting: Endocrinology

## 2021-04-30 VITALS — BP 120/64 | HR 78 | Ht 68.0 in | Wt 186.6 lb

## 2021-04-30 DIAGNOSIS — I63532 Cerebral infarction due to unspecified occlusion or stenosis of left posterior cerebral artery: Secondary | ICD-10-CM | POA: Diagnosis not present

## 2021-04-30 DIAGNOSIS — E11319 Type 2 diabetes mellitus with unspecified diabetic retinopathy without macular edema: Secondary | ICD-10-CM

## 2021-04-30 DIAGNOSIS — E119 Type 2 diabetes mellitus without complications: Secondary | ICD-10-CM

## 2021-04-30 DIAGNOSIS — Z794 Long term (current) use of insulin: Secondary | ICD-10-CM | POA: Diagnosis not present

## 2021-04-30 MED ORDER — INSULIN ASPART 100 UNIT/ML IJ SOLN: INTRAMUSCULAR | 99 refills | Status: DC

## 2021-04-30 MED ORDER — INSULIN GLARGINE 100 UNIT/ML SOLOSTAR PEN: 18.0000 [IU] | PEN_INJECTOR | Freq: Every day | SUBCUTANEOUS | 11 refills | Status: DC

## 2021-04-30 NOTE — Patient Instructions (Addendum)
check your blood sugar 4 times a day: before the 3 meals, and at bedtime.  also check if you have symptoms of your blood sugar being too high or too low.  please keep a record of the readings and bring it to your next appointment here (or you can bring the meter itself).  You can write it on any piece of paper.  please call us sooner if your blood sugar goes below 70, or if you have a lot of readings over 200. For now, please increase Lantus to 18 units at bedtime.   When you resume the pump, please take these pump settings:  basal rate of 0.6 units/hr.  bolus of 1 unit/10 grams carbohydrate.   correction bolus (which some people call "sensitivity," or "insulin sensitivity ratio," or just "isr") of 1 unit for each 50 by which your glucose exceeds 100.   Please come back for a follow-up appointment in 2 months.

## 2021-04-30 NOTE — Progress Notes (Signed)
Subjective:    Patient ID: William Fitzpatrick, male    DOB: [birth year], 65 y.o.   MRN: [id number]  HPI Pt returns for f/u of diabetes mellitus: DM type: Insulin-requiring type 2 DM Dx'ed: 1994 Complications: DR Therapy: insulin since 2010 (pump rx since 2021) DKA: never Severe hypoglycemia: never Pancreatitis: never Pancreatic imaging: never SDOH: he did not get Rybelsus, due to cost. Other: he also took pump rx in 2020 Interval history: when he took pump rx, he took these settings: basal rate of 0.6 units/hr.  bolus of 1 unit/10 grams carbohydrate.   correction bolus (which some people call "sensitivity," or "insulin sensitivity ratio," or just "isr") of 1 unit for each 50 by which your glucose exceeds 100.  We are unable to access continuous glucose monitor data today.  Pt says ins would not send new pump until he returned the old one, and supplies.  He takes Glargine 15 units QHS, and Novolog 5 units 3 times a day (just before each meal).  Since on this rx, glucose varies from 150-200.  It is in general highest fasting.   Past Medical History:  Diagnosis Date  . Schizophrenia (HCC)   . Stroke [facility])     Past Surgical History:  Procedure Laterality Date  . LOOP RECORDER INSERTION N/A 01/30/2021   Procedure: LOOP RECORDER INSERTION;  Surgeon: Marinus Maw, MD;  Location: [facility] INVASIVE CV LAB;  Service: Cardiovascular;  Laterality: N/A;    Social History   Socioeconomic History  . Marital status: Divorced    Spouse name: Not on file  . Number of children: Not on file  . Years of education: Not on file  . Highest education level: Not on file  Occupational History  . Not on file  Tobacco Use  . Smoking status: Current Every Day Smoker    Types: Cigarettes  . Smokeless tobacco: Never Used  Vaping Use  . Vaping Use: Never used  Substance and Sexual Activity  . Alcohol use: Yes  . Drug use: Not Currently  . Sexual activity: Not on file  Other Topics Concern  . Not on file   Social History Narrative  . Not on file   Social Determinants of Health   Financial Resource Strain: Not on file  Food Insecurity: No Food Insecurity  . Worried About Programme researcher, broadcasting/film/video in the Last Year: Never true  . Ran Out of Food in the Last Year: Never true  Transportation Needs: No Transportation Needs  . Lack of Transportation (Medical): No  . Lack of Transportation (Non-Medical): No  Physical Activity: Not on file  Stress: Not on file  Social Connections: Not on file  Intimate Partner Violence: Not on file    Current Outpatient Medications on File Prior to Visit  Medication Sig Dispense Refill  . amLODipine (NORVASC) 10 MG tablet Take 1 tablet (10 mg total) by mouth daily. Restart on 2/18 (Patient taking differently: Take 10 mg by mouth daily.)    . Apoaequorin (PREVAGEN PO) Take 1 capsule by mouth daily.    Marland Kitchen aspirin 81 MG EC tablet Take 1 tablet (81 mg total) by mouth daily. Swallow whole. 21 tablet 11  . aspirin 81 MG EC tablet TAKE 1 TABLET (81 MG TOTAL) BY MOUTH DAILY. SWALLOW WHOLE. (Patient taking differently: Take 81 mg by mouth daily. SWALLOW WHOLE.) 21 tablet 11  . clopidogrel (PLAVIX) 75 MG tablet Take 1 tablet (75 mg total) by mouth daily. 30 tablet 1  . clopidogrel (PLAVIX)  75 MG tablet TAKE 1 TABLET (75 MG TOTAL) BY MOUTH DAILY. 21 tablet 0  . Continuous Blood Gluc Sensor (DEXCOM G6 SENSOR) MISC 1 Device by Does not apply route See admin instructions. 10 each 3  . divalproex (DEPAKOTE ER) 500 MG 24 hr tablet Take 500 mg by mouth daily.    . hydrochlorothiazide (MICROZIDE) 12.5 MG capsule Take 1 capsule (12.5 mg total) by mouth daily. Resume on 2/18 (Patient taking differently: Take 12.5 mg by mouth daily.)    . INVEGA SUSTENNA 234 MG/1.5ML SUSY injection Inject 234 mg into the muscle every 30 (thirty) days.    Marland Kitchen levothyroxine (SYNTHROID) 75 MCG tablet Take 75 mcg by mouth daily before breakfast.    . metFORMIN (GLUCOPHAGE-XR) 500 MG 24 hr tablet Take 1,000 mg  by mouth 2 (two) times daily.    . nicotine (NICODERM CQ - DOSED IN MG/24 HOURS) 21 mg/24hr patch Place 1 patch (21 mg total) onto the skin daily. 28 patch 0  . nicotine (NICODERM CQ - DOSED IN MG/24 HOURS) 21 mg/24hr patch PLACE 1 PATCH (21 MG TOTAL) ONTO THE SKIN DAILY. 28 patch 0  . OLANZapine (ZYPREXA) 20 MG tablet Take 10 mg by mouth at bedtime.    . rosuvastatin (CRESTOR) 20 MG tablet Take 1 tablet (20 mg total) by mouth daily. 30 tablet 0  . rosuvastatin (CRESTOR) 20 MG tablet TAKE 1 TABLET (20 MG TOTAL) BY MOUTH DAILY. (Patient taking differently: Take 20 mg by mouth daily.) 30 tablet 0  . Semaglutide (RYBELSUS) 3 MG TABS Take 3 mg by mouth daily. 90 tablet 3   No current facility-administered medications on file prior to visit.    No Known Allergies  Family History  Problem Relation Age of Onset  . Diabetes Neg Hx   . Stroke Neg Hx     BP 120/64 (BP Location: Right Arm, Patient Position: Sitting, Cuff Size: Normal)   Pulse 78   Ht 5\' 8"  (1.727 m)   Wt 186 lb 9.6 oz (84.6 kg)   SpO2 97%   BMI 28.37 kg/m    Review of Systems     Objective:   Physical Exam VITAL SIGNS:  See vs page GENERAL: no distress Pulses: dorsalis pedis intact bilat.   MSK: no deformity of the feet CV: no leg edema Skin:  no ulcer on the feet.  normal color and temp on the feet. Neuro: sensation is intact to touch on the feet  A1c=10.2%     Assessment & Plan:  Insulin-requiring type 2 DM: uncertain etiology and prognosis.  He needs to resume pump rx  Patient Instructions  check your blood sugar 4 times a day: before the 3 meals, and at bedtime.  also check if you have symptoms of your blood sugar being too high or too low.  please keep a record of the readings and bring it to your next appointment here (or you can bring the meter itself).  You can write it on any piece of paper.  please call sooner if your blood sugar goes below 70, or if you have a lot of readings over 200. For now,  please increase Lantus to 18 units at bedtime.   When you resume the pump, please take these pump settings:  basal rate of 0.6 units/hr.  bolus of 1 unit/10 grams carbohydrate.   correction bolus (which some people call "sensitivity," or "insulin sensitivity ratio," or just "isr") of 1 unit for each 50 by which your glucose  exceeds 100.   Please come back for a follow-up appointment in 2 months.

## 2021-05-07 ENCOUNTER — Other Ambulatory Visit: Payer: Self-pay

## 2021-05-07 ENCOUNTER — Encounter: Payer: Medicare Other | Attending: Endocrinology | Admitting: Nutrition

## 2021-05-07 DIAGNOSIS — E119 Type 2 diabetes mellitus without complications: Secondary | ICD-10-CM | POA: Insufficient documentation

## 2021-05-08 NOTE — Progress Notes (Signed)
Patient was trained on the use of the Tandem Control IQ insulin pump.  Settings were inserted per last office note/and transferred from his old pump.  Basal rate:  MN        I/C:   ISF:   Target:  He is currently wearing a Dexcom. This was linked to his pump, and the pump to the [employer].  Control IQ was turned on and he was shown how to fill a cartridge and insert the new infusion set--          He filled the cartridge with Novolog insulin and attached the set to his right upper abdomen.  I strongly encouraged him to rotate sites and showed him new sites to try.   He redemonstrated how to bolus X2 correctly.   He was shown how answer alerts/alarms, how to correct no signal alarms, and how to start/stop pump..  Handouts given for all of the above topics as well as how to link pump, symbols when using control IQ, how to do an extended bolus, how to change cartridge, give a regular and correction bolus, and sick day and high blood sugar guidelines.  He was encouraged to review them all and call the help line if questions.   He signed the checklist as understanding all topics and had no final questions.

## 2021-05-08 NOTE — Patient Instructions (Signed)
Review handouts given Call Tandem help line if questions.

## 2021-05-09 ENCOUNTER — Ambulatory Visit (INDEPENDENT_AMBULATORY_CARE_PROVIDER_SITE_OTHER): Payer: Medicare Other

## 2021-05-09 DIAGNOSIS — I63532 Cerebral infarction due to unspecified occlusion or stenosis of left posterior cerebral artery: Secondary | ICD-10-CM | POA: Diagnosis not present

## 2021-05-13 LAB — CUP PACEART REMOTE DEVICE CHECK
Date Time Interrogation Session: 20220529062403
Implantable Pulse Generator Implant Date: 20220216

## 2021-06-03 NOTE — Progress Notes (Signed)
Carelink Summary Report / Loop Recorder 

## 2021-06-04 ENCOUNTER — Encounter: Payer: Self-pay | Admitting: Podiatry

## 2021-06-04 ENCOUNTER — Other Ambulatory Visit: Payer: Self-pay

## 2021-06-04 ENCOUNTER — Ambulatory Visit (INDEPENDENT_AMBULATORY_CARE_PROVIDER_SITE_OTHER): Payer: Medicare Other | Admitting: Podiatry

## 2021-06-04 DIAGNOSIS — B351 Tinea unguium: Secondary | ICD-10-CM

## 2021-06-04 DIAGNOSIS — E1140 Type 2 diabetes mellitus with diabetic neuropathy, unspecified: Secondary | ICD-10-CM

## 2021-06-04 DIAGNOSIS — E1149 Type 2 diabetes mellitus with other diabetic neurological complication: Secondary | ICD-10-CM

## 2021-06-04 DIAGNOSIS — D689 Coagulation defect, unspecified: Secondary | ICD-10-CM | POA: Diagnosis not present

## 2021-06-04 DIAGNOSIS — M79675 Pain in left toe(s): Secondary | ICD-10-CM | POA: Diagnosis not present

## 2021-06-04 DIAGNOSIS — M79674 Pain in right toe(s): Secondary | ICD-10-CM

## 2021-06-04 NOTE — Progress Notes (Signed)
This patient returns to my office for at risk foot care.  This patient requires this care by a professional since this patient will be at risk due to having diabetic neuropathy and coagulation defect.  This patient is taking plavix.  This patient is unable to cut nails himself since the patient cannot reach his nails.These nails are painful walking and wearing shoes.  This patient presents for at risk foot care today.  General Appearance  Alert, conversant and in no acute stress.  Vascular  Dorsalis pedis and posterior tibial  pulses are palpable  bilaterally.  Capillary return is within normal limits  bilaterally. Temperature is within normal limits  bilaterally.  Neurologic  Senn-Weinstein monofilament wire test within normal limits  bilaterally. Muscle power within normal limits bilaterally.  Nails Thick disfigured discolored nails with subungual debris  from hallux to fifth toes bilaterally. No evidence of bacterial infection or drainage bilaterally.  Orthopedic  No limitations of motion  feet .  No crepitus or effusions noted.  No bony pathology or digital deformities noted.  Skin  normotropic skin with no porokeratosis noted bilaterally.  No signs of infections or ulcers noted.     Onychomycosis  Pain in right toes  Pain in left toes  Consent was obtained for treatment procedures.   Mechanical debridement of nails 1-5  bilaterally performed with a nail nipper.  Filed with dremel without incident.    Return office visit    3 months                  Told patient to return for periodic foot care and evaluation due to potential at risk complications.   Helane Gunther DPM

## 2021-06-10 ENCOUNTER — Ambulatory Visit (INDEPENDENT_AMBULATORY_CARE_PROVIDER_SITE_OTHER): Payer: Medicare Other

## 2021-06-10 DIAGNOSIS — I63532 Cerebral infarction due to unspecified occlusion or stenosis of left posterior cerebral artery: Secondary | ICD-10-CM | POA: Diagnosis not present

## 2021-06-19 ENCOUNTER — Telehealth: Payer: Self-pay | Admitting: Endocrinology

## 2021-06-19 ENCOUNTER — Encounter: Payer: Self-pay | Admitting: Endocrinology

## 2021-06-19 LAB — CUP PACEART REMOTE DEVICE CHECK
Date Time Interrogation Session: 20220701062750
Implantable Pulse Generator Implant Date: 20220216

## 2021-06-19 NOTE — Telephone Encounter (Signed)
Patient called to advised that some of his Tandem settings are not longer programmed, he needs assistance ASAP.  Call back # [phone]

## 2021-06-20 NOTE — Telephone Encounter (Signed)
Patient left a message that he was having T-slim insulin pump issues and needed his settings.    Called patient.  He states that he called Tandem and that his pump is now working well.    Patient with no further questions.  Oran Rein, RD, LDN, CDCES

## 2021-06-28 NOTE — Progress Notes (Signed)
Carelink Summary Report / Loop Recorder 

## 2021-07-03 ENCOUNTER — Ambulatory Visit (INDEPENDENT_AMBULATORY_CARE_PROVIDER_SITE_OTHER): Payer: Medicare Other | Admitting: Endocrinology

## 2021-07-03 ENCOUNTER — Other Ambulatory Visit: Payer: Self-pay

## 2021-07-03 ENCOUNTER — Encounter: Payer: Self-pay | Admitting: Endocrinology

## 2021-07-03 VITALS — BP 120/78 | HR 80 | Ht 68.0 in | Wt 188.0 lb

## 2021-07-03 DIAGNOSIS — Z794 Long term (current) use of insulin: Secondary | ICD-10-CM | POA: Diagnosis not present

## 2021-07-03 DIAGNOSIS — I63532 Cerebral infarction due to unspecified occlusion or stenosis of left posterior cerebral artery: Secondary | ICD-10-CM | POA: Diagnosis not present

## 2021-07-03 DIAGNOSIS — E11319 Type 2 diabetes mellitus with unspecified diabetic retinopathy without macular edema: Secondary | ICD-10-CM | POA: Diagnosis not present

## 2021-07-03 DIAGNOSIS — E119 Type 2 diabetes mellitus without complications: Secondary | ICD-10-CM | POA: Diagnosis not present

## 2021-07-03 LAB — POCT GLYCOSYLATED HEMOGLOBIN (HGB A1C): Hemoglobin A1C: 9.2 % — AB (ref 4.0–5.6)

## 2021-07-03 MED ORDER — TRULICITY 0.75 MG/0.5ML ~~LOC~~ SOAJ: 0.7500 mg | SUBCUTANEOUS | 3 refills | Status: DC

## 2021-07-03 NOTE — Progress Notes (Signed)
Subjective:    Patient ID: William Fitzpatrick, male    DOB: 02/03/1956, 65 y.o.   MRN: [id number]  HPI Pt returns for f/u of diabetes mellitus:  DM type: Insulin-requiring type 2 DM Dx'ed: 1994 Complications: DR Therapy: insulin since 2010 (pump rx since 2021).  DKA: never Severe hypoglycemia: never Pancreatitis: never Pancreatic imaging: never SDOH: he did not get Rybelsus, due to cost. Other: he also took pump rx in 2020; he takes Tandem pump Interval history: when he took pump rx, he took these settings: basal rate of 0.6 units/hr.  bolus of 1 unit/10 grams carbohydrate.   correction bolus (which some people call "sensitivity," or "insulin sensitivity ratio," or just "isr") of 1 unit for each 50 by which your glucose exceeds 100.  continuous glucose monitor data are reviewed.  Glucose varies from 47-281.  There is no trend throughout the day, but it varies most widely in the afternoon.   TDD is 26 units (54% basal).  Control IQ is on 80% of the time.   Past Medical History:  Diagnosis Date   Schizophrenia (HCC)    Stroke [facility])     Past Surgical History:  Procedure Laterality Date   LOOP RECORDER INSERTION N/A 01/30/2021   Procedure: LOOP RECORDER INSERTION;  Surgeon: Marinus Maw, MD;  Location: [facility] INVASIVE CV LAB;  Service: Cardiovascular;  Laterality: N/A;    Social History   Socioeconomic History   Marital status: Divorced    Spouse name: Not on file   Number of children: Not on file   Years of education: Not on file   Highest education level: Not on file  Occupational History   Not on file  Tobacco Use   Smoking status: Every Day    Types: Cigarettes   Smokeless tobacco: Never  Vaping Use   Vaping Use: Never used  Substance and Sexual Activity   Alcohol use: Yes   Drug use: Not Currently   Sexual activity: Not on file  Other Topics Concern   Not on file  Social History Narrative   Not on file   Social Determinants of Health   Financial Resource  Strain: Not on file  Food Insecurity: No Food Insecurity   Worried About Running Out of Food in the Last Year: Never true   Ran Out of Food in the Last Year: Never true  Transportation Needs: No Transportation Needs   Lack of Transportation (Medical): No   Lack of Transportation (Non-Medical): No  Physical Activity: Not on file  Stress: Not on file  Social Connections: Not on file  Intimate Partner Violence: Not on file    Current Outpatient Medications on File Prior to Visit  Medication Sig Dispense Refill   amLODipine (NORVASC) 10 MG tablet Take 1 tablet (10 mg total) by mouth daily. Restart on 2/18 (Patient taking differently: Take 10 mg by mouth daily.)     Apoaequorin (PREVAGEN PO) Take 1 capsule by mouth daily.     aspirin 81 MG EC tablet Take 1 tablet (81 mg total) by mouth daily. Swallow whole. 21 tablet 11   aspirin 81 MG EC tablet TAKE 1 TABLET (81 MG TOTAL) BY MOUTH DAILY. SWALLOW WHOLE. (Patient taking differently: Take 81 mg by mouth daily. SWALLOW WHOLE.) 21 tablet 11   clopidogrel (PLAVIX) 75 MG tablet Take 1 tablet (75 mg total) by mouth daily. 30 tablet 1   Continuous Blood Gluc Sensor (DEXCOM G6 SENSOR) MISC 1 Device by Does not apply route See admin  instructions. 10 each 3   divalproex (DEPAKOTE ER) 500 MG 24 hr tablet Take 500 mg by mouth daily.     hydrochlorothiazide (MICROZIDE) 12.5 MG capsule Take 1 capsule (12.5 mg total) by mouth daily. Resume on 2/18 (Patient taking differently: Take 12.5 mg by mouth daily.)     insulin aspart (NOVOLOG) 100 UNIT/ML injection For use in pump, total of 60 units per day 60 mL PRN   INVEGA SUSTENNA 234 MG/1.5ML SUSY injection Inject 234 mg into the muscle every 30 (thirty) days.     levothyroxine (SYNTHROID) 75 MCG tablet Take 75 mcg by mouth daily before breakfast.     metFORMIN (GLUCOPHAGE-XR) 500 MG 24 hr tablet Take 1,000 mg by mouth 2 (two) times daily.     nicotine (NICODERM CQ - DOSED IN MG/24 HOURS) 21 mg/24hr patch Place 1  patch (21 mg total) onto the skin daily. 28 patch 0   nicotine (NICODERM CQ - DOSED IN MG/24 HOURS) 21 mg/24hr patch PLACE 1 PATCH (21 MG TOTAL) ONTO THE SKIN DAILY. 28 patch 0   OLANZapine (ZYPREXA) 20 MG tablet Take 10 mg by mouth at bedtime.     rosuvastatin (CRESTOR) 20 MG tablet Take 1 tablet (20 mg total) by mouth daily. 30 tablet 0   rosuvastatin (CRESTOR) 20 MG tablet TAKE 1 TABLET (20 MG TOTAL) BY MOUTH DAILY. (Patient taking differently: Take 20 mg by mouth daily.) 30 tablet 0   Semaglutide (RYBELSUS) 3 MG TABS Take 3 mg by mouth daily. 90 tablet 3   No current facility-administered medications on file prior to visit.    No Known Allergies  Family History  Problem Relation Age of Onset   Diabetes Neg Hx    Stroke Neg Hx     BP 120/78 (BP Location: Left Arm, Patient Position: Sitting)   Pulse 80   Ht 5\' 8"  (1.727 m)   Wt 188 lb (85.3 kg)   SpO2 99%   BMI 28.59 kg/m    Review of Systems     Objective:   Physical Exam Pulses: dorsalis pedis intact bilat.   MSK: no deformity of the feet CV: no leg edema Skin:  no ulcer on the feet.  normal color and temp on the feet. Neuro: sensation is intact to touch on the feet.     Lab Results  Component Value Date   HGBA1C 9.2 (A) 07/03/2021      Assessment & Plan:  Insulin-requiring type 2 DM: uncontrolled  Patient Instructions  check your blood sugar 4 times a day: before the 3 meals, and at bedtime.  also check if you have symptoms of your blood sugar being too high or too low.  please keep a record of the readings and bring it to your next appointment here (or you can bring the meter itself).  You can write it on any piece of paper.  please call 07/05/2021 sooner if your blood sugar goes below 70, or if you have a lot of readings over 200. I have sent a prescription to your pharmacy, to add "Trulicity." please continue these pump settings:  basal rate of 0.6 units/hr (when not in "control IQ" mode).   bolus of 1 unit/10  grams carbohydrate.   correction bolus (which some people call "sensitivity," or "insulin sensitivity ratio," or just "isr") of 1 unit for each 50 by which your glucose exceeds 100.   Please come back for a follow-up appointment in 2 months.

## 2021-07-03 NOTE — Patient Instructions (Addendum)
check your blood sugar 4 times a day: before the 3 meals, and at bedtime.  also check if you have symptoms of your blood sugar being too high or too low.  please keep a record of the readings and bring it to your next appointment here (or you can bring the meter itself).  You can write it on any piece of paper.  please call us sooner if your blood sugar goes below 70, or if you have a lot of readings over 200. I have sent a prescription to your pharmacy, to add "Trulicity." please continue these pump settings:  basal rate of 0.6 units/hr (when not in "control IQ" mode).   bolus of 1 unit/10 grams carbohydrate.   correction bolus (which some people call "sensitivity," or "insulin sensitivity ratio," or just "isr") of 1 unit for each 50 by which your glucose exceeds 100.   Please come back for a follow-up appointment in 2 months.

## 2021-07-17 ENCOUNTER — Ambulatory Visit (INDEPENDENT_AMBULATORY_CARE_PROVIDER_SITE_OTHER): Payer: Medicare Other

## 2021-07-17 DIAGNOSIS — I63532 Cerebral infarction due to unspecified occlusion or stenosis of left posterior cerebral artery: Secondary | ICD-10-CM | POA: Diagnosis not present

## 2021-07-17 LAB — CUP PACEART REMOTE DEVICE CHECK
Date Time Interrogation Session: 20220803062435
Implantable Pulse Generator Implant Date: 20220216

## 2021-08-04 ENCOUNTER — Ambulatory Visit (INDEPENDENT_AMBULATORY_CARE_PROVIDER_SITE_OTHER)
Admission: EM | Admit: 2021-08-04 | Discharge: 2021-08-04 | Disposition: A | Discharge status: Other Acute Inpatient Hospital | Payer: Medicare Other | Source: Home / Self Care

## 2021-08-04 ENCOUNTER — Other Ambulatory Visit: Payer: Self-pay

## 2021-08-04 ENCOUNTER — Encounter (HOSPITAL_COMMUNITY): Payer: Self-pay | Admitting: Emergency Medicine

## 2021-08-04 ENCOUNTER — Emergency Department (HOSPITAL_COMMUNITY)
Admission: EM | Admit: 2021-08-04 | Discharge: 2021-08-05 | Disposition: A | Discharge status: Home / Self Care | Payer: Medicare Other | Attending: Emergency Medicine | Admitting: Emergency Medicine

## 2021-08-04 DIAGNOSIS — Z794 Long term (current) use of insulin: Secondary | ICD-10-CM | POA: Insufficient documentation

## 2021-08-04 DIAGNOSIS — F17290 Nicotine dependence, other tobacco product, uncomplicated: Secondary | ICD-10-CM | POA: Insufficient documentation

## 2021-08-04 DIAGNOSIS — F22 Delusional disorders: Secondary | ICD-10-CM

## 2021-08-04 DIAGNOSIS — F209 Schizophrenia, unspecified: Secondary | ICD-10-CM | POA: Insufficient documentation

## 2021-08-04 DIAGNOSIS — Z9151 Personal history of suicidal behavior: Secondary | ICD-10-CM | POA: Insufficient documentation

## 2021-08-04 DIAGNOSIS — Z20822 Contact with and (suspected) exposure to covid-19: Secondary | ICD-10-CM | POA: Diagnosis not present

## 2021-08-04 DIAGNOSIS — R45851 Suicidal ideations: Secondary | ICD-10-CM | POA: Diagnosis not present

## 2021-08-04 DIAGNOSIS — I10 Essential (primary) hypertension: Secondary | ICD-10-CM | POA: Diagnosis present

## 2021-08-04 DIAGNOSIS — E039 Hypothyroidism, unspecified: Secondary | ICD-10-CM | POA: Diagnosis not present

## 2021-08-04 DIAGNOSIS — F17210 Nicotine dependence, cigarettes, uncomplicated: Secondary | ICD-10-CM | POA: Insufficient documentation

## 2021-08-04 DIAGNOSIS — F200 Paranoid schizophrenia: Secondary | ICD-10-CM

## 2021-08-04 DIAGNOSIS — E11319 Type 2 diabetes mellitus with unspecified diabetic retinopathy without macular edema: Secondary | ICD-10-CM | POA: Diagnosis not present

## 2021-08-04 HISTORY — DX: Acute myocardial infarction, unspecified: I21.9

## 2021-08-04 HISTORY — DX: Type 2 diabetes mellitus without complications: E11.9

## 2021-08-04 HISTORY — DX: Essential (primary) hypertension: I10

## 2021-08-04 LAB — COMPREHENSIVE METABOLIC PANEL
ALT: 19 U/L (ref 0–44)
AST: 16 U/L (ref 15–41)
Albumin: 4 g/dL (ref 3.5–5.0)
Alkaline Phosphatase: 95 U/L (ref 38–126)
Anion gap: 7 (ref 5–15)
BUN: 9 mg/dL (ref 8–23)
CO2: 22 mmol/L (ref 22–32)
Calcium: 10.1 mg/dL (ref 8.9–10.3)
Chloride: 105 mmol/L (ref 98–111)
Creatinine, Ser: 0.8 mg/dL (ref 0.61–1.24)
GFR, Estimated: >60 mL/min (ref >60)
Glucose, Bld: 179 mg/dL — ABNORMAL HIGH (ref 70–99)
Potassium: 4 mmol/L (ref 3.5–5.1)
Sodium: 134 mmol/L — ABNORMAL LOW (ref 135–145)
Total Bilirubin: 1 mg/dL (ref 0.3–1.2)
Total Protein: 7.1 g/dL (ref 6.5–8.1)

## 2021-08-04 LAB — SALICYLATE LEVEL: Salicylate Lvl: <7 mg/dL — ABNORMAL LOW (ref 7.0–30.0)

## 2021-08-04 LAB — CBC
HCT: 51.4 % (ref 39.0–52.0)
Hemoglobin: 17.9 g/dL — ABNORMAL HIGH (ref 13.0–17.0)
MCH: 31.7 pg (ref 26.0–34.0)
MCHC: 34.8 g/dL (ref 30.0–36.0)
MCV: 91.1 fL (ref 80.0–100.0)
Platelets: 188 10*3/uL (ref 150–400)
RBC: 5.64 MIL/uL (ref 4.22–5.81)
RDW: 12.6 % (ref 11.5–15.5)
WBC: 9.9 10*3/uL (ref 4.0–10.5)
nRBC: 0 % (ref 0.0–0.2)

## 2021-08-04 LAB — ACETAMINOPHEN LEVEL: Acetaminophen (Tylenol), Serum: <10 ug/mL — ABNORMAL LOW (ref 10–30)

## 2021-08-04 LAB — RESP PANEL BY RT-PCR (FLU A&B, COVID) ARPGX2
Influenza A by PCR: NEGATIVE
Influenza B by PCR: NEGATIVE
SARS Coronavirus 2 by RT PCR: NEGATIVE

## 2021-08-04 LAB — RAPID URINE DRUG SCREEN, HOSP PERFORMED
Amphetamines: NOT DETECTED
Barbiturates: NOT DETECTED
Benzodiazepines: NOT DETECTED
Cocaine: NOT DETECTED
Opiates: NOT DETECTED
Tetrahydrocannabinol: NOT DETECTED

## 2021-08-04 LAB — ETHANOL: Alcohol, Ethyl (B): <10 mg/dL (ref <10)

## 2021-08-04 LAB — CBG MONITORING, ED: Glucose-Capillary: 170 mg/dL — ABNORMAL HIGH (ref 70–99)

## 2021-08-04 MED ORDER — LABETALOL HCL 200 MG PO TABS
100.0000 mg | ORAL_TABLET | Freq: Two times a day (BID) | ORAL | Status: DC
  Administered 2021-08-04 – 2021-08-05 (×2): 100 mg via ORAL
  Filled 2021-08-04 (×2): qty 1

## 2021-08-04 MED ORDER — ROSUVASTATIN CALCIUM 20 MG PO TABS
20.0000 mg | ORAL_TABLET | Freq: Every day | ORAL | Status: DC
  Administered 2021-08-04 – 2021-08-05 (×2): 20 mg via ORAL
  Filled 2021-08-04 (×2): qty 1

## 2021-08-04 MED ORDER — CLOPIDOGREL BISULFATE 75 MG PO TABS
75.0000 mg | ORAL_TABLET | Freq: Every day | ORAL | Status: DC
  Administered 2021-08-04 – 2021-08-05 (×2): 75 mg via ORAL
  Filled 2021-08-04 (×2): qty 1

## 2021-08-04 MED ORDER — LEVOTHYROXINE SODIUM 75 MCG PO TABS: 75.0000 ug | ORAL_TABLET | Freq: Every day | ORAL | Status: DC

## 2021-08-04 MED ORDER — NICOTINE 21 MG/24HR TD PT24
21.0000 mg | MEDICATED_PATCH | Freq: Every day | TRANSDERMAL | Status: DC
  Filled 2021-08-04: qty 1

## 2021-08-04 MED ORDER — HYDROCHLOROTHIAZIDE 12.5 MG PO CAPS
12.5000 mg | ORAL_CAPSULE | Freq: Every day | ORAL | Status: DC
  Administered 2021-08-04 – 2021-08-05 (×2): 12.5 mg via ORAL
  Filled 2021-08-04 (×2): qty 1

## 2021-08-04 MED ORDER — HYDRALAZINE HCL 20 MG/ML IJ SOLN
10.0000 mg | Freq: Once | INTRAMUSCULAR | Status: AC
  Administered 2021-08-04: 10 mg via INTRAVENOUS
  Filled 2021-08-04: qty 1

## 2021-08-04 MED ORDER — HYDRALAZINE HCL 25 MG PO TABS
25.0000 mg | ORAL_TABLET | Freq: Once | ORAL | Status: AC
  Administered 2021-08-04: 25 mg via ORAL
  Filled 2021-08-04: qty 1

## 2021-08-04 MED ORDER — HYDRALAZINE HCL 10 MG PO TABS
10.0000 mg | ORAL_TABLET | Freq: Once | ORAL | Status: AC
  Administered 2021-08-04: 10 mg via ORAL
  Filled 2021-08-04: qty 1

## 2021-08-04 MED ORDER — LEVOTHYROXINE SODIUM 75 MCG PO TABS
75.0000 ug | ORAL_TABLET | Freq: Every day | ORAL | Status: DC
  Administered 2021-08-05: 75 ug via ORAL
  Filled 2021-08-04 (×2): qty 1

## 2021-08-04 MED ORDER — METFORMIN HCL ER 500 MG PO TB24
1000.0000 mg | ORAL_TABLET | Freq: Two times a day (BID) | ORAL | Status: DC
  Administered 2021-08-04 – 2021-08-05 (×2): 1000 mg via ORAL
  Filled 2021-08-04 (×5): qty 2
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
## 2021-08-04 NOTE — ED Notes (Signed)
Pt given another cup of ice water and has a pillow placed behind his head. States that he is feeling much better emotionally after seeing his blood pressure decreasing.

## 2021-08-04 NOTE — Progress Notes (Signed)
Per Tilford Pillar, patient meets criteria for inpatient treatment. There are no available or appropriate beds at [facility] today. CSW faxed referrals to the following facilities for review:  [facility]  Pending - Request Sent N/A [address] [place] [address] [phone] --  [facility]  Pending - Request Sent N/A [address] [place], [place] [address] [phone] --  [facility]  Pending - Request Sent N/A [address] [place] [address] [phone] --  [facility]  Pending - Request Sent N/A [address], [place] [address] [phone] --  [facility]  Pending - Request Sent N/A [address] [place] [address] [phone] --  [facility]  Pending - Request Sent N/A [address] [place] [address] [phone] --  [facility]  Pending - Request Sent N/A [address] [place] [address] [phone]    TTS will continue to seek bed placement.  Crissie Reese, MSW, LCSW-A, LCAS-A Phone: [phone] Disposition/TOC
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
## 2021-08-04 NOTE — ED Notes (Signed)
[facility] providers made aware of pt removing IV

## 2021-08-04 NOTE — ED Provider Notes (Signed)
[facility] EMERGENCY DEPARTMENT Provider Note   CSN: [id number] Arrival date & time: 08/04/21  1213     History Chief Complaint  Patient presents with   Hypertension   Medical Clearance    William Fitzpatrick is a 65 y.o. male history includes schizophrenia, CVA, diabetes, alcohol abuse, hyperlipidemia, hypertension, hypothyroidism.  Patient sent over today from behavioral health urgent care for concern of hypertension.  Behavioral health is recommending overnight observation and reassessment tomorrow morning but would like [place] to address patient's hypertension while he is here.  Patient reports that he stopped taking his blood pressure medication sometime ago because he feels that someone is coming into his house and poisoning his food and medications.  On my exam patient is alert oriented cooperative and in no acute distress.  He denies any pain injuries or concerns.  He denies SI/HI.  He denies chest pain shortness of breath abdominal pain nausea vomiting dizziness headache vision changes or any additional concerns. HPI     Past Medical History:  Diagnosis Date   Schizophrenia (HCC)    Stroke [facility])     Patient Active Problem List   Diagnosis Date Noted   Blood clotting disorder (HCC) 06/04/2021   Type 2 diabetes mellitus with hyperglycemia (HCC) 02/11/2021   Vitamin D deficiency 02/11/2021   Impacted cerumen, bilateral 02/11/2021   Complete loss of teeth due to caries, class I 02/11/2021   Abnormal involuntary movements 02/11/2021   Alcohol abuse 02/11/2021   Constipation 02/11/2021   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, not elsewhere classified 02/11/2021   Diabetes mellitus type 2 without retinopathy (HCC) 02/11/2021   Disorder of bursae and tendons in shoulder region 02/11/2021   Disorder of refraction and accommodation 02/11/2021   Excessive cerumen in ear canal 02/11/2021   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 02/11/2021   Hip pain  02/11/2021   Inguinal hernia 02/11/2021   Major depressive disorder, single episode 02/11/2021   Mild nonproliferative diabetic retinopathy (HCC) 02/11/2021   Mixed hyperlipidemia 02/11/2021   Obesity 02/11/2021   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   Pure hypercholesterolemia 02/11/2021   Special screening for malignant neoplasms, colon 02/11/2021   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   Seborrheic dermatitis, unspecified 02/11/2021   Restless legs syndrome 02/11/2021   Need for assistance with personal care 02/11/2021   Hyperlipidemia 02/11/2021   Persons encountering health services in other specified circumstances 02/11/2021   Counseling, unspecified 02/11/2021   Fractured dental restorative material, unspecified 02/11/2021   Nail dystrophy 02/11/2021   Other specified urinary incontinence 02/11/2021   Other abnormalities of gait and mobility 02/11/2021   Other general symptoms and signs 02/11/2021   Encounter for general adult medical examination w/o abnormal findings 02/11/2021   Encounter for dental exam and cleaning w/o abnormal findings 02/11/2021   Encounter for examination and observation for other specified reasons 02/11/2021   Encounter for immunization 02/11/2021   Fitting and adjustment of spectacles and contact lenses 02/11/2021   Encounter for other specified aftercare 02/11/2021   Spiritual or religious counseling 02/11/2021   Other specified counseling 02/11/2021   Nicotine dependence, other tobacco product, uncomplicated 01/29/2021   Hypertensive crisis, unspecified 01/29/2021   Hypothyroidism, unspecified 01/29/2021   Acute cerebrovascular accident (CVA) due to occlusion of left posterior cerebral artery (HCC) 01/28/2021   Type 2 or unspecified type diabetes mellitus 10/27/2020   Schizophrenia, paranoid (HCC) 10/01/2019   Suicidal thoughts     Past Surgical History:  Procedure Laterality Date  LOOP RECORDER INSERTION N/A 01/30/2021    Procedure: LOOP RECORDER INSERTION;  Surgeon: Marinus Maw, MD;  Location: [facility] INVASIVE CV LAB;  Service: Cardiovascular;  Laterality: N/A;       Family History  Problem Relation Age of Onset   Diabetes Neg Hx    Stroke Neg Hx     Social History   Tobacco Use   Smoking status: Every Day    Types: Cigarettes   Smokeless tobacco: Never  Vaping Use   Vaping Use: Never used  Substance Use Topics   Alcohol use: Yes   Drug use: Not Currently    Home Medications Prior to Admission medications   Medication Sig Start Date End Date Taking? Authorizing Provider  ARIPiprazole ER (ABILIFY MAINTENA) 400 MG SRER injection Inject 400 mg into the muscle every 30 (thirty) days. 05/02/21 05/03/22 Yes [provider]  insulin aspart (NOVOLOG) 100 UNIT/ML injection For use in pump, total of 60 units per day 04/30/21  Yes Romero Belling, MD  nicotine (NICODERM CQ - DOSED IN MG/24 HOURS) 21 mg/24hr patch Place 1 patch (21 mg total) onto the skin daily. 01/31/21  Yes Rizwan, Ladell Heads, MD  Apoaequorin (PREVAGEN PO) Take 1 capsule by mouth daily. Patient not taking: Reported on 08/04/2021    [provider]  aspirin 81 MG EC tablet TAKE 1 TABLET (81 MG TOTAL) BY MOUTH DAILY. SWALLOW WHOLE. Patient not taking: No sig reported 01/30/21 01/30/22  Calvert Cantor, MD  clopidogrel (PLAVIX) 75 MG tablet Take 1 tablet (75 mg total) by mouth daily. Patient not taking: Reported on 08/04/2021 01/31/21   Calvert Cantor, MD  Continuous Blood Gluc Sensor (DEXCOM G6 SENSOR) MISC 1 Device by Does not apply route See admin instructions. 11/12/20   Romero Belling, MD  divalproex (DEPAKOTE ER) 500 MG 24 hr tablet Take 500 mg by mouth daily. Patient not taking: Reported on 08/04/2021 03/15/21   [provider]  Dulaglutide (TRULICITY) 0.75 MG/0.5ML SOPN Inject 0.75 mg into the skin once a week. Patient not taking: No sig reported 07/03/21   Romero Belling, MD  hydrochlorothiazide (MICROZIDE) 12.5 MG capsule Take  1 capsule (12.5 mg total) by mouth daily. Resume on 2/18 Patient not taking: No sig reported 01/30/21   Calvert Cantor, MD  [facility] SUSTENNA 234 MG/1.5ML SUSY injection Inject 234 mg into the muscle every 30 (thirty) days. Patient not taking: Reported on 08/04/2021 04/01/21   [provider]  levothyroxine (SYNTHROID) 75 MCG tablet Take 75 mcg by mouth daily before breakfast. Patient not taking: Reported on 08/04/2021    [provider]  metFORMIN (GLUCOPHAGE-XR) 500 MG 24 hr tablet Take 1,000 mg by mouth 2 (two) times daily. Patient not taking: Reported on 08/04/2021 08/14/20   [provider]  nicotine (NICODERM CQ - DOSED IN MG/24 HOURS) 21 mg/24hr patch PLACE 1 PATCH (21 MG TOTAL) ONTO THE SKIN DAILY. Patient not taking: Reported on 08/04/2021 01/30/21 01/30/22  Calvert Cantor, MD  OLANZapine (ZYPREXA) 20 MG tablet Take 10 mg by mouth at bedtime. Patient not taking: Reported on 08/04/2021    [provider]  rosuvastatin (CRESTOR) 20 MG tablet Take 1 tablet (20 mg total) by mouth daily. Patient not taking: Reported on 08/04/2021 01/31/21   Calvert Cantor, MD  rosuvastatin (CRESTOR) 20 MG tablet TAKE 1 TABLET (20 MG TOTAL) BY MOUTH DAILY. Patient not taking: No sig reported 01/30/21 01/30/22  Calvert Cantor, MD  Semaglutide (RYBELSUS) 3 MG TABS Take 3 mg by mouth daily. Patient not  taking: No sig reported 12/27/20   Romero Belling, MD    Allergies    Patient has no known allergies.  Review of Systems   Review of Systems Ten systems are reviewed and are negative for acute change except as noted in the HPI  Physical Exam Updated Vital Signs BP 135/87   Pulse 85   Temp 97.9 F (36.6 C) (Oral)   Resp 19   Ht 5\' 8"  (1.727 m)   Wt 85.3 kg   SpO2 98%   BMI 28.59 kg/m   Physical Exam Constitutional:      General: He is not in acute distress.    Appearance: Normal appearance. He is well-developed. He is not ill-appearing or diaphoretic.  HENT:     Head:  Normocephalic and atraumatic.  Eyes:     General: Vision grossly intact. Gaze aligned appropriately.     Pupils: Pupils are equal, round, and reactive to light.  Neck:     Trachea: Trachea and phonation normal.  Pulmonary:     Effort: Pulmonary effort is normal. No respiratory distress.  Abdominal:     General: There is no distension.     Palpations: Abdomen is soft.     Tenderness: There is no abdominal tenderness. There is no guarding or rebound.  Musculoskeletal:        General: Normal range of motion.     Cervical back: Normal range of motion.  Skin:    General: Skin is warm and dry.  Neurological:     Mental Status: He is alert.     GCS: GCS eye subscore is 4. GCS verbal subscore is 5. GCS motor subscore is 6.     Comments: Speech is clear and goal oriented, follows commands Major Cranial nerves without deficit, no facial droop Moves extremities without ataxia, coordination intact  Psychiatric:        Behavior: Behavior normal.    ED Results / Procedures / Treatments   Labs (all labs ordered are listed, but only abnormal results are displayed) Labs Reviewed  COMPREHENSIVE METABOLIC PANEL - Abnormal; Notable for the following components:      Result Value   Sodium 134 (*)    Glucose, Bld 179 (*)    All other components within normal limits  CBC - Abnormal; Notable for the following components:   Hemoglobin 17.9 (*)    All other components within normal limits  SALICYLATE LEVEL - Abnormal; Notable for the following components:   Salicylate Lvl <7.0 (*)    All other components within normal limits  ACETAMINOPHEN LEVEL - Abnormal; Notable for the following components:   Acetaminophen (Tylenol), Serum <10 (*)    All other components within normal limits  CBG MONITORING, ED - Abnormal; Notable for the following components:   Glucose-Capillary 170 (*)    All other components within normal limits  RESP PANEL BY RT-PCR (FLU A&B, COVID) ARPGX2  ETHANOL  RAPID URINE DRUG  SCREEN, HOSP PERFORMED    EKG None  Radiology No results found.  Procedures Procedures   Medications Ordered in ED Medications  clopidogrel (PLAVIX) tablet 75 mg (has no administration in time range)  hydrochlorothiazide (MICROZIDE) capsule 12.5 mg (has no administration in time range)  metFORMIN (GLUCOPHAGE-XR) 24 hr tablet 1,000 mg (has no administration in time range)  levothyroxine (SYNTHROID) tablet 75 mcg (has no administration in time range)  nicotine (NICODERM CQ - dosed in mg/24 hours) patch 21 mg (has no administration in time range)  rosuvastatin (CRESTOR) tablet  20 mg (has no administration in time range)  hydrALAZINE (APRESOLINE) tablet 10 mg (10 mg Oral Given 08/04/21 1342)    ED Course  I have reviewed the triage vital signs and the nursing notes.  Pertinent labs & imaging results that were available during my care of the patient were reviewed by me and considered in my medical decision making (see chart for details).  Clinical Course as of 08/04/21 1545  Sun Aug 04, 2021  [id number] [phone]  Georgann HousekeeperBilly Bourcier [BM]    Clinical Course User Index [BM] Elizabeth PalauMorelli, Alizaya Oshea A, PA-C   MDM Rules/Calculators/A&P                          Additional history obtained from: Nursing notes from this visit. Review of electronic medical records. Patient's family member, brother Genevie CheshireBilly. --- Per patient request I spoke with his brother Genevie CheshireBilly over the phone who updated me on patient's recent delusions and that he stopped taking his medication. - I ordered, reviewed and interpreted labs which include: COVID/influenza panel negative. CBG 170. Tylenol and salicylate levels negative.  No evidence for toxic ingestion of the substances. Ethanol level negative, patient does not appear intoxicated or in withdrawal. CMP shows no emergent electrolyte derangement, AKI, LFT elevations or gap. CBC shows hemoglobin 17.9 otherwise within normal limits.  No stoicism suggest infectious  process.  No anemia.  No thrombocytopenia = Case was discussed with attending physician Dr. Estell HarpinZammit, patient was treated with 10 mg oral hydralazine for blood pressure.  This improved his blood pressure to 135/87.  On reassessment patient is resting well-appearing in no acute distress.  Vital signs stable.  We will restart the patient on his home medications today.   = At this time there does not appear to be any evidence of an acute emergency medical condition and the patient appears stable for psychiatric disposition.  I have reordered patient's blood pressure medications.  I have placed a consultation to diabetic nurse coordinator to help with management of his diabetes medications.  Discussed plan of care with Dr. Estell HarpinZammit who agrees with medical clearance.  Note: Portions of this report may have been transcribed using voice recognition software. Every effort was made to ensure accuracy; however, inadvertent computerized transcription errors may still be present.  Final Clinical Impression(s) / ED Diagnoses Final diagnoses:  [facility](HCC)    Rx / DC Orders ED Discharge Orders     None        Elizabeth PalauMorelli, Rickeya Manus A, PA-C 08/04/21 1545    Bethann BerkshireZammit, Joseph, MD 08/05/21 1555

## 2021-08-04 NOTE — ED Notes (Signed)
RN spoke with pt and addressed pt's concerns. Pt stated that he wanted his last rites read to him and that he texted his brother to inform him that "I only have 2 hours to live." This RN reassured pt that he is not in any physiological danger and that there are no concerns regarding his lifespan at this time. RN emphasized to pt that he is only in the emergency department to lower his blood pressure, and then he will go back to his behavioral health facility. Pt acknowledged these statements and reassurances and thanked this RN for the explanations. Pt now watching television in his room, calm and cooperative at this time.

## 2021-08-04 NOTE — ED Notes (Signed)
Patient requested that I call daughter, Karna Christmas to provide her with an update and add her as his primary emergency contact. This RN called the patient's daughter and updated her on the plan of care. Brandy wanted to also provide some background information on the patient. Daughter reports the patient recently moved here from [place] due to his paranoia and all of his family lives in [place]. The patient wrote his family a letter stating he was Jesus Christ/God amongst other delusional comments. Patient has been noncompliant with medications for an unknown amount of time and has been consuming ETOH. Gearldine Bienenstock also stated that she is able to send copies of the letters sent by the patient if that is needed. Brandy expressed concern for his safety and the safety of others due to his delusional state and recent increase in agitation over phone/text. Karna Christmas can be contacted by phone at [phone].

## 2021-08-04 NOTE — ED Notes (Signed)
Attempted to call Gearldine Bienenstock, pt's daughter, to update her on pt's emotional status. (See previous blank note.)

## 2021-08-04 NOTE — ED Notes (Addendum)
Patient Behavioral cleared from [facility] for BP management.  The Resident has sent message stating that this patient is not BH cleared and may need obs admission

## 2021-08-04 NOTE — ED Notes (Signed)
RN spoke with pt's daughter, Karna Christmas, on the phone. Brandy informed this RN that pt has been texting her repeatedly and requesting her to send someone "to read his last rights" in relation to psychiatric history and past medical history (per daughter, pt has history of stroke and heart attack). This RN informed Gearldine Bienenstock that pt appears calm and comfortable in room and demeanor does not match his messages. Plan to speak with pt and address his concerns & re-orient to reality and situation.

## 2021-08-04 NOTE — Progress Notes (Signed)
Patient was brought to the [facility] by the police after his brother called for a welfare check.  Patient states that his brother called after he had a conversation with him over the phone.  Patient states, "I am [employer] and I have written a 14 page manuscript that was stolen and has been distributed around town and now no restaurants will serve me."  Patient states that he was diagnosed with Schizophrenia in 1988 and states that he is followed by the [facility]. Patient states that he was in [employer] for 12 years.  Patient states that he receives an [employer] and states that he is current with his shots and just had one two weeks ago.  Patient states that he is scheduled to have a virtual appointment with his psychiatrist on Tuesday, August 23rd.  Patient denies SI/HI. He states that he has one previous suicide attempt  He states that he has not been sleeping well, but his appetite has been okay. Patient states that he is divored and that he currently resides alone.  Patient denies any drug or alcohol use.

## 2021-08-04 NOTE — Discharge Instructions (Signed)
Transfer to [facility] for high blood pressure

## 2021-08-04 NOTE — ED Provider Notes (Signed)
Behavioral Health Urgent Care Medical Screening Exam  Patient Name: William Fitzpatrick MRN: [id number] Date of Evaluation: 08/04/21 Chief Complaint:   Diagnosis:  Final diagnoses:  Delusion of persecution [facility])    History of Present illness: William Fitzpatrick is a 65 y.o. male with history of schizophrenia presenting to the [facility] via [employer] because of bizarre behavior.  Patient's brother had called [employer] for a welfare check because patient had sent brother a message stating that he believed that his food has been poisoned and that someone is out to murder him.  In speaking with the patient, patient states that he believes someone has been through his apartment because his cigars were missing and that he was feeling heartburn and stomachache from his "poisoned food".  Patient incidentally also has a blood pressure of 204/104.  Patient denies any headache, weakness or pain/paresthesia at this time.  Patient states he feels "good".  Patient denies SI/HI/AVH.  States that he has been diagnosed with schizophrenia in 1988 and irregularly on Abilify injection.  Patient states that he does not know if Abilify injection worked because he is "[employer]".  Patient states that this fixed delusion of him being Jesus Lorie Apley has been going on for 3 years and that he had written a manuscript that he suspects is the reason that someone is out to murder him.  Patient has had prior psychiatric admissions and a suicide attempt in 1990.  Patient denies substance use does not drink alcohol.  Patient does smoke 6 cigars a day.  Patient has a medical history of diabetes.  Psychiatric Specialty Exam  Presentation  General Appearance:Appropriate for Environment; Casual  Eye Contact:Good  Speech:Normal Rate  Speech Volume:Normal  Handedness:Right   Mood and Affect  Mood:Euthymic  Affect:Flat   Thought Process  Thought Processes:Coherent  Descriptions of Associations:Intact  Orientation:Full (Time, Place and  Person)  Thought Content:Logical  Diagnosis of Schizophrenia or Schizoaffective disorder in past: Yes  Duration of Psychotic Symptoms: Less than six months  Hallucinations:None  Ideas of Reference:None  Suicidal Thoughts:No  Homicidal Thoughts:No   Sensorium  Memory:Immediate Good; Recent Good; Remote Good  Judgment:Intact  Insight:Fair   Executive Functions  Concentration:Fair  Attention Span:Good  Recall:Good  Fund of Knowledge:Fair  Language:Fair   Psychomotor Activity  Psychomotor Activity:Normal   Assets  Assets:Housing; Leisure Time; Social Support   Sleep  Sleep:Poor  Number of hours: 6   No data recorded  Physical Exam: Physical Exam Vitals and nursing note reviewed.  Constitutional:      Appearance: He is well-developed.  HENT:     Head: Normocephalic and atraumatic.  Eyes:     Conjunctiva/sclera: Conjunctivae normal.  Cardiovascular:     Rate and Rhythm: Normal rate and regular rhythm.     Heart sounds: No murmur heard. Pulmonary:     Effort: Pulmonary effort is normal. No respiratory distress.     Breath sounds: Normal breath sounds.  Abdominal:     Palpations: Abdomen is soft.     Tenderness: There is no abdominal tenderness.  Musculoskeletal:     Cervical back: Neck supple.  Skin:    General: Skin is warm and dry.  Neurological:     Mental Status: He is alert.   Review of Systems  Constitutional:  Negative for chills and fever.  Respiratory:  Negative for cough, shortness of breath and wheezing.   Cardiovascular:  Negative for chest pain and palpitations.  Gastrointestinal:  Negative for abdominal pain, nausea and vomiting.  Skin:  Negative  for itching and rash.  Neurological:  Negative for dizziness and headaches.  Blood pressure (!) 204/104, pulse 89, temperature 98.5 F (36.9 C), resp. rate 18, SpO2 97 %. There is no height or weight on file to calculate BMI.  Musculoskeletal: Strength & Muscle Tone: within normal  limits Gait & Station: normal Patient leans: Front   [facility] Discharge Disposition for Follow up and Recommendations: Based on my evaluation the patient appears to have an emergency medical condition for which I recommend the patient be transferred to the emergency department for further evaluation.   Patient has been sent over to [facility], ED for blood pressure management.  Recommend overnight observation at this time given new delusion of persecution and psychiatric evaluation in the morning.  Patient agreeable as he wanted "blood test" to find out if he had been poisoned.  Patient attributes his present high blood pressure due to being "poisoned".  Park Pope, MD 08/04/2021, 12:47 PM

## 2021-08-05 ENCOUNTER — Encounter (HOSPITAL_COMMUNITY): Payer: Self-pay | Admitting: Registered Nurse

## 2021-08-05 DIAGNOSIS — F200 Paranoid schizophrenia: Secondary | ICD-10-CM | POA: Diagnosis not present

## 2021-08-05 DIAGNOSIS — R45851 Suicidal ideations: Secondary | ICD-10-CM

## 2021-08-05 DIAGNOSIS — F22 Delusional disorders: Secondary | ICD-10-CM | POA: Insufficient documentation

## 2021-08-05 LAB — CBG MONITORING, ED: Glucose-Capillary: 134 mg/dL — ABNORMAL HIGH (ref 70–99)

## 2021-08-05 MED ORDER — INSULIN ASPART 100 UNIT/ML IJ SOLN
3.0000 [IU] | Freq: Three times a day (TID) | INTRAMUSCULAR | Status: DC
  Administered 2021-08-05: 3 [IU] via SUBCUTANEOUS

## 2021-08-05 MED ORDER — INSULIN ASPART 100 UNIT/ML IJ SOLN
0.0000 [IU] | Freq: Three times a day (TID) | INTRAMUSCULAR | Status: DC
  Administered 2021-08-05: 2 [IU] via SUBCUTANEOUS

## 2021-08-05 MED ORDER — INSULIN GLARGINE-YFGN 100 UNIT/ML ~~LOC~~ SOLN: 20.0000 [IU] | Freq: Every day | SUBCUTANEOUS | Status: DC

## 2021-08-05 MED ORDER — ACETAMINOPHEN 325 MG PO TABS
650.0000 mg | ORAL_TABLET | ORAL | Status: DC | PRN
  Administered 2021-08-05: 650 mg via ORAL
  Filled 2021-08-05: qty 2

## 2021-08-05 MED ORDER — INSULIN ASPART 100 UNIT/ML IJ SOLN: 0.0000 [IU] | Freq: Every day | INTRAMUSCULAR | Status: DC

## 2021-08-05 NOTE — Progress Notes (Signed)
Inpatient Diabetes Program Recommendations  AACE/ADA: New Consensus Statement on Inpatient Glycemic Control (2015)  Target Ranges:  Prepandial:   less than 140 mg/dL      Peak postprandial:   less than 180 mg/dL (1-2 hours)      Critically ill patients:  140 - 180 mg/dL   Lab Results  Component Value Date   GLUCAP 170 (H) 08/04/2021   HGBA1C 9.2 (A) 07/03/2021    Review of Glycemic Control Results for William Fitzpatrick, William Fitzpatrick (MRN [id number]) as of 08/05/2021 12:20  Ref. Range 08/04/2021 13:30  Glucose-Capillary Latest Ref Range: 70 - 99 mg/dL 585 (H)   Diabetes history: Type 2 DM Outpatient Diabetes medications: Trulicity 0.75 mg qwk (NT), Rybelsus 3 mg QD ( NT), Novolog per Tandem/Dexcom @ 1.5 units/hr CHO ratio 1:10 SF 1:50 Current orders for Inpatient glycemic control: none  Inpatient Diabetes Program Recommendations:    Patient is not appropriate for insulin pump use while being cleared medically for BH.   Instead Consider: -Semglee 20 units QD -Novolog 3 units TID (assuming patient is consuming >50% of meals) -Novolog 0-15 units TID & HS -Remove insulin pump once patient has received basal insulin.  Discussed with Dr Rush Landmark.  Spoke with patient at bedside. Patient has insulin pump applied and answering questions appropriately. See Dr Everardo All, outpatient endocrinology with last appointment on 07/03/21. Current rates above. Explained to patient that injections were going to be used instead of insulin pump. In agreement and has no further questions at this time.     Thanks, Lujean Rave, MSN, RNC-OB Diabetes Coordinator [phone] (8a-5p)

## 2021-08-05 NOTE — ED Provider Notes (Signed)
Nursing informed me that patient has been psychiatric cleared.  I assessed the patient he is denying any complaints at this time.  He agrees to follow-up with his [facility] appointment tomorrow.  Patient we discharged per plan.  Clinical Impression: 1. Delusion [facility])     Disposition: Discharge  Condition: Good  I have discussed the results, Dx and Tx plan with the pt(& family if present). He/she/they expressed understanding and agree(s) with the plan. Discharge instructions discussed at great length. Strict return precautions discussed and pt &/or family have verbalized understanding of the instructions. No further questions at time of discharge.    New Prescriptions   No medications on file    Follow Up: Clinic, [facility] [address] [place] [address] [phone]  Follow up on 08/06/2021 Keep scheduled appointment at 10:00 AM     Quintavious Rinck, Canary Brim, MD 08/05/21 [phone]

## 2021-08-05 NOTE — Progress Notes (Signed)
Patient information has been sent to [facility] via secure chat to review for potential admission. Patient has not yet been accepted at this time. Patient meets inpatient criteria per Park Pope, MD.   Situation ongoing, CSW will continue to monitor and update note as more information becomes available.    Signed:  Corky Crafts, MSW, [place], LCASA 08/05/2021 10:48 AM

## 2021-08-05 NOTE — ED Notes (Signed)
Pt has insulin pump on. CBG reading 159. Basal rate 1.5 Units per hour

## 2021-08-05 NOTE — Progress Notes (Signed)
Pt is recommended for psychiatric inpt per Park Pope, MD.   CSW contacted all [facility] [place] [place], and [place]) to determine bed availability; confirmed no metal health inpatient beds available in the state. CSW will pursue alternative placement at this time.    April w/ [facility] informed CSW that the patient is connected to the [facility] for mental health care. His social worker is Esau Grew, LCSWA [phone] ext. [phone]) and he is followed by Dr. Chilton Si.   Signed:  Corky Crafts, MSW, [place], LCASA 08/05/2021 10:43 AM

## 2021-08-05 NOTE — BH Assessment (Signed)
[facility] Assessment Progress Note   Per Shuvon Rankin, NP, this pt does not require psychiatric hospitalization at this time.  Pt is psychiatrically cleared.  Discharge instructions advise pt to follow up with [facility].  Pt's nurse, Paden, has been notified.  Doylene Canning, MA Triage Specialist [phone]

## 2021-08-05 NOTE — Consult Note (Signed)
Telepsych Consultation   Reason for Consult:  Psychosis and delusional thinking Referring Physician:  Elizabeth Palau Location of Patient: [facility] ED Location of Provider: Other: [facility]  Patient Identification: William Fitzpatrick MRN:  [id number] Principal Diagnosis: Schizophrenia, paranoid (HCC) Diagnosis:  Principal Problem:   Schizophrenia, paranoid (HCC) Active Problems:   Suicidal thoughts   Total Time spent with patient: 30 minutes  Subjective:   William Fitzpatrick is a 65 y.o. male patient admitted to [facility] ED after being sent from [facility] where patient initially presented via law enforcement after a well check called in by patients brother and complaint that patient was delusional.  Patient reporting chronic history of delusional thinking.  Patient reports he lives alone but has a good support system.  Patient reports he is able to care for himself as far as bathing eating dressing but does have a made to come in his home once a month to do a deep cleaning.  HPI:  William Fitzpatrick, 65 y.o., male patient seen via tele health by this provider, consulted with Dr. Nelly Rout; and chart reviewed on 08/05/21.  On evaluation William Fitzpatrick reports he was brought to the hospital because his brother had some concerns about him.  "I guess he was concerned because sent him a text telling him that with a mildly was out to kill me."  Patient reported he is feeling fine this morning.  Patient denies suicidal/self-harm/homicidal ideation, auditory/visual hallucinations, and paranoia.  Patient does state that he has a chronic history of fixed delusion that he is [employer].  Patient reports that he has outpatient psychiatric services through [facility] and his next psychiatric appointment is August 23 at 10 AM.  Patient also reports that he got his Abilify Maintena injection 2 weeks ago.  Patient reports he takes his medications as prescribed.  Patient reported he has no problems with  eating/sleeping.  Patient states "I sleep better in my own bed.  I just want to go home so I can relax and smoke a cigar." During evaluation William Fitzpatrick is elevated up in bed in no acute distress.  He is alert/oriented x 4.  He was able to give correct information for age, date of birth, current place, city, country, and the last 3 presidents.  He is calm/cooperative; and mood congruent with affect.  He is speaking in a clear tone at moderate volume, and normal pace; with good eye contact.  His thought process is coherent and relevant; There is no indication that he is currently responding to internal/external stimuli or experiencing delusional thought content other than him reporting that he has a chronic history of delusional thinking that he is [employer].  Patient also reports chronic paranoia.  At this time he has denied suicidal/self-harm/homicidal ideation, psychosis, and paranoia.   Patient has remained calm throughout assessment and has answered questions appropriately.     Past Psychiatric History: Paranoid schizophrenia   Past Medical History:  Past Medical History:  Diagnosis Date   Diabetes mellitus without complication (HCC)    Hypertension    Myocardial infarction (HCC)    Schizophrenia (HCC)    Stroke [facility])     Past Surgical History:  Procedure Laterality Date   LOOP RECORDER INSERTION N/A 01/30/2021   Procedure: LOOP RECORDER INSERTION;  Surgeon: Marinus Maw, MD;  Location: [facility] INVASIVE CV LAB;  Service: Cardiovascular;  Laterality: N/A;   Family History:  Family History  Problem Relation Age of Onset   Diabetes Neg Hx  Stroke Neg Hx    Family Psychiatric  History: None reported Social History:  Social History   Substance and Sexual Activity  Alcohol Use Yes     Social History   Substance and Sexual Activity  Drug Use Not Currently    Social History   Socioeconomic History   Marital status: Divorced    Spouse name: Not on file   Number of  children: Not on file   Years of education: Not on file   Highest education level: Not on file  Occupational History   Not on file  Tobacco Use   Smoking status: Every Day    Types: Cigarettes   Smokeless tobacco: Never  Vaping Use   Vaping Use: Never used  Substance and Sexual Activity   Alcohol use: Yes   Drug use: Not Currently   Sexual activity: Not on file  Other Topics Concern   Not on file  Social History Narrative   Not on file   Social Determinants of Health   Financial Resource Strain: Not on file  Food Insecurity: No Food Insecurity   Worried About Running Out of Food in the Last Year: Never true   Ran Out of Food in the Last Year: Never true  Transportation Needs: No Transportation Needs   Lack of Transportation (Medical): No   Lack of Transportation (Non-Medical): No  Physical Activity: Not on file  Stress: Not on file  Social Connections: Not on file   Additional Social History:    Allergies:  No Known Allergies  Labs:  Results for orders placed or performed during the hospital encounter of 08/04/21 (from the past 48 hour(s))  Comprehensive metabolic panel     Status: Abnormal   Collection Time: 08/04/21 12:45 PM  Result Value Ref Range   Sodium 134 (L) 135 - 145 mmol/L   Potassium 4.0 3.5 - 5.1 mmol/L   Chloride 105 98 - 111 mmol/L   CO2 22 22 - 32 mmol/L   Glucose, Bld 179 (H) 70 - 99 mg/dL    Comment: Glucose reference range applies only to samples taken after fasting for at least 8 hours.   BUN 9 8 - 23 mg/dL   Creatinine, Ser 2.53 0.61 - 1.24 mg/dL   Calcium 66.4 8.9 - 40.3 mg/dL   Total Protein 7.1 6.5 - 8.1 g/dL   Albumin 4.0 3.5 - 5.0 g/dL   AST 16 15 - 41 U/L   ALT 19 0 - 44 U/L   Alkaline Phosphatase 95 38 - 126 U/L   Total Bilirubin 1.0 0.3 - 1.2 mg/dL   GFR, Estimated >47 >42 mL/min    Comment: (NOTE) Calculated using the CKD-EPI Creatinine Equation (2021)    Anion gap 7 5 - 15    Comment: Performed at [facility] Lab,  [address], [place], [place] [address]  cbc     Status: Abnormal   Collection Time: 08/04/21 12:45 PM  Result Value Ref Range   WBC 9.9 4.0 - 10.5 K/uL   RBC 5.64 4.22 - 5.81 MIL/uL   Hemoglobin 17.9 (H) 13.0 - 17.0 g/dL   HCT 87.5 64.3 - 32.9 %   MCV 91.1 80.0 - 100.0 fL   MCH 31.7 26.0 - 34.0 pg   MCHC 34.8 30.0 - 36.0 g/dL   RDW 51.8 84.1 - 66.0 %   Platelets 188 150 - 400 K/uL   nRBC 0.0 0.0 - 0.2 %    Comment: Performed at [facility]  Lab, [address], [place], [place] [address]  Ethanol     Status: None   Collection Time: 08/04/21 12:45 PM  Result Value Ref Range   Alcohol, Ethyl (B) <10 <10 mg/dL    Comment: (NOTE) Lowest detectable limit for serum alcohol is 10 mg/dL.  For medical purposes only. Performed at [facility] Lab, [address] [place], [place] [address]   Salicylate level     Status: Abnormal   Collection Time: 08/04/21 12:45 PM  Result Value Ref Range   Salicylate Lvl <7.0 (L) 7.0 - 30.0 mg/dL    Comment: Performed at [facility] Lab, [address] [place], [place] [address]  Acetaminophen level     Status: Abnormal   Collection Time: 08/04/21 12:45 PM  Result Value Ref Range   Acetaminophen (Tylenol), Serum <10 (L) 10 - 30 ug/mL    Comment: (NOTE) Therapeutic concentrations vary significantly. A range of 10-30 ug/mL  may be an effective concentration for many patients. However, some  are best treated at concentrations outside of this range. Acetaminophen concentrations >150 ug/mL at 4 hours after ingestion  and >50 ug/mL at 12 hours after ingestion are often associated with  toxic reactions.  Performed at [facility], [address], [place], [place] [address]   Resp Panel by RT-PCR (Flu A&B, Covid) Nasopharyngeal Swab     Status: None   Collection Time: 08/04/21  1:20 PM   Specimen: Nasopharyngeal Swab; Nasopharyngeal(NP) swabs in vial transport medium  Result Value Ref Range   SARS Coronavirus 2 by RT PCR NEGATIVE  NEGATIVE    Comment: (NOTE) SARS-CoV-2 target nucleic acids are NOT DETECTED.  The SARS-CoV-2 RNA is generally detectable in upper respiratory specimens during the acute phase of infection. The lowest concentration of SARS-CoV-2 viral copies this assay can detect is 138 copies/mL. A negative result does not preclude SARS-Cov-2 infection and should not be used as the sole basis for treatment or other patient management decisions. A negative result may occur with  improper specimen collection/handling, submission of specimen other than nasopharyngeal swab, presence of viral mutation(s) within the areas targeted by this assay, and inadequate number of viral copies(<138 copies/mL). A negative result must be combined with clinical observations, patient history, and epidemiological information. The expected result is Negative.  Fact Sheet for Patients:  BloggerCourse.com  Fact Sheet for Healthcare Providers:  SeriousBroker.it  This test is no t yet approved or cleared by the [place] FDA and  has been authorized for detection and/or diagnosis of SARS-CoV-2 by FDA under an Emergency Use Authorization (EUA). This EUA will remain  in effect (meaning this test can be used) for the duration of the COVID-19 declaration under Section 564(b)(1) of the Act, 21 U.S.C.section 360bbb-3(b)(1), unless the authorization is terminated  or revoked sooner.       Influenza A by PCR NEGATIVE NEGATIVE   Influenza B by PCR NEGATIVE NEGATIVE    Comment: (NOTE) The Xpert Xpress SARS-CoV-2/FLU/RSV plus assay is intended as an aid in the diagnosis of influenza from Nasopharyngeal swab specimens and should not be used as a sole basis for treatment. Nasal washings and aspirates are unacceptable for Xpert Xpress SARS-CoV-2/FLU/RSV testing.  Fact Sheet for Patients: BloggerCourse.com  Fact Sheet for Healthcare  Providers: SeriousBroker.it  This test is not yet approved or cleared by the [place] FDA and has been authorized for detection and/or diagnosis of SARS-CoV-2 by FDA under an Emergency Use Authorization (EUA). This EUA will remain  in effect (meaning this test can be used) for the duration of the COVID-19 declaration under Section 564(b)(1) of the Act, 21 U.S.C. section 360bbb-3(b)(1), unless the authorization is terminated or revoked.  Performed at [facility], [address] [place], [place] [address]   POC CBG, ED     Status: Abnormal   Collection Time: 08/04/21  1:30 PM  Result Value Ref Range   Glucose-Capillary 170 (H) 70 - 99 mg/dL    Comment: Glucose reference range applies only to samples taken after fasting for at least 8 hours.  Rapid urine drug screen (hospital performed)     Status: None   Collection Time: 08/04/21  3:33 PM  Result Value Ref Range   Opiates NONE DETECTED NONE DETECTED   Cocaine NONE DETECTED NONE DETECTED   Benzodiazepines NONE DETECTED NONE DETECTED   Amphetamines NONE DETECTED NONE DETECTED   Tetrahydrocannabinol NONE DETECTED NONE DETECTED   Barbiturates NONE DETECTED NONE DETECTED    Comment: (NOTE) DRUG SCREEN FOR MEDICAL PURPOSES ONLY.  IF CONFIRMATION IS NEEDED FOR ANY PURPOSE, NOTIFY LAB WITHIN 5 DAYS.  LOWEST DETECTABLE LIMITS FOR URINE DRUG SCREEN Drug Class                     Cutoff (ng/mL) Amphetamine and metabolites    1000 Barbiturate and metabolites    200 Benzodiazepine                 200 Tricyclics and metabolites     300 Opiates and metabolites        300 Cocaine and metabolites        300 THC                            50 Performed at [facility], [address] [place], [place] [address]   CBG monitoring, ED     Status: Abnormal   Collection Time: 08/05/21 12:21 PM  Result Value Ref Range   Glucose-Capillary 134 (H) 70 - 99 mg/dL    Comment: Glucose reference range  applies only to samples taken after fasting for at least 8 hours.    Medications:  Current Facility-Administered Medications  Medication Dose Route Frequency Provider Last Rate Last Admin   acetaminophen (TYLENOL) tablet 650 mg  650 mg Oral PRN Melene PlanFloyd, Dan, DO   650 mg at 08/05/21 0743   clopidogrel (PLAVIX) tablet 75 mg  75 mg Oral Daily Harlene SaltsMorelli, Brandon A, PA-C   75 mg at 08/05/21 0920   hydrochlorothiazide (MICROZIDE) capsule 12.5 mg  12.5 mg Oral Daily Harlene SaltsMorelli, Brandon A, PA-C   12.5 mg at 08/05/21 [id number]   insulin aspart (novoLOG) injection 0-15 Units  0-15 Units Subcutaneous TID WC Tegeler, Canary Brimhristopher J, MD   2 Units at 08/05/21 1318   insulin aspart (novoLOG) injection 0-5 Units  0-5 Units Subcutaneous QHS Tegeler, Canary Brimhristopher J, MD       insulin aspart (novoLOG) injection 3 Units  3 Units Subcutaneous TID WC Tegeler, Canary Brimhristopher J, MD   3 Units at 08/05/21 1318   insulin glargine-yfgn (SEMGLEE) injection 20 Units  20 Units Subcutaneous QHS Tegeler, Canary Brimhristopher J, MD       labetalol (NORMODYNE) tablet 100 mg  100 mg Oral BID Bethann BerkshireZammit, Joseph, MD   100 mg at 08/05/21 0920   levothyroxine (SYNTHROID) tablet 75 mcg  75 mcg Oral Q0600 Harlene SaltsMorelli, Brandon A, PA-C   75 mcg at 08/05/21  0528   metFORMIN (GLUCOPHAGE-XR) 24 hr tablet 1,000 mg  1,000 mg Oral BID WC Harlene Salts A, PA-C   1,000 mg at 08/05/21 0744   nicotine (NICODERM CQ - dosed in mg/24 hours) patch 21 mg  21 mg Transdermal Daily Harlene Salts A, PA-C       rosuvastatin (CRESTOR) tablet 20 mg  20 mg Oral Daily Harlene Salts A, PA-C   20 mg at 08/05/21 1610   Current Outpatient Medications  Medication Sig Dispense Refill   ARIPiprazole ER (ABILIFY MAINTENA) 400 MG SRER injection Inject 400 mg into the muscle every 30 (thirty) days.     insulin aspart (NOVOLOG) 100 UNIT/ML injection For use in pump, total of 60 units per day 60 mL PRN   nicotine (NICODERM CQ - DOSED IN MG/24 HOURS) 21 mg/24hr patch Place 1 patch (21 mg total)  onto the skin daily. 28 patch 0   Apoaequorin (PREVAGEN PO) Take 1 capsule by mouth daily. (Patient not taking: Reported on 08/04/2021)     aspirin 81 MG EC tablet TAKE 1 TABLET (81 MG TOTAL) BY MOUTH DAILY. SWALLOW WHOLE. (Patient not taking: No sig reported) 21 tablet 11   clopidogrel (PLAVIX) 75 MG tablet Take 1 tablet (75 mg total) by mouth daily. (Patient not taking: Reported on 08/04/2021) 30 tablet 1   Continuous Blood Gluc Sensor (DEXCOM G6 SENSOR) MISC 1 Device by Does not apply route See admin instructions. 10 each 3   divalproex (DEPAKOTE ER) 500 MG 24 hr tablet Take 500 mg by mouth daily. (Patient not taking: Reported on 08/04/2021)     Dulaglutide (TRULICITY) 0.75 MG/0.5ML SOPN Inject 0.75 mg into the skin once a week. (Patient not taking: No sig reported) 6 mL 3   hydrochlorothiazide (MICROZIDE) 12.5 MG capsule Take 1 capsule (12.5 mg total) by mouth daily. Resume on 2/18 (Patient not taking: No sig reported)     INVEGA SUSTENNA 234 MG/1.5ML SUSY injection Inject 234 mg into the muscle every 30 (thirty) days. (Patient not taking: Reported on 08/04/2021)     levothyroxine (SYNTHROID) 75 MCG tablet Take 75 mcg by mouth daily before breakfast. (Patient not taking: Reported on 08/04/2021)     metFORMIN (GLUCOPHAGE-XR) 500 MG 24 hr tablet Take 1,000 mg by mouth 2 (two) times daily. (Patient not taking: Reported on 08/04/2021)     nicotine (NICODERM CQ - DOSED IN MG/24 HOURS) 21 mg/24hr patch PLACE 1 PATCH (21 MG TOTAL) ONTO THE SKIN DAILY. (Patient not taking: Reported on 08/04/2021) 28 patch 0   OLANZapine (ZYPREXA) 20 MG tablet Take 10 mg by mouth at bedtime. (Patient not taking: Reported on 08/04/2021)     rosuvastatin (CRESTOR) 20 MG tablet Take 1 tablet (20 mg total) by mouth daily. (Patient not taking: Reported on 08/04/2021) 30 tablet 0   rosuvastatin (CRESTOR) 20 MG tablet TAKE 1 TABLET (20 MG TOTAL) BY MOUTH DAILY. (Patient not taking: No sig reported) 30 tablet 0   Semaglutide (RYBELSUS) 3  MG TABS Take 3 mg by mouth daily. (Patient not taking: No sig reported) 90 tablet 3    Musculoskeletal: Strength & Muscle Tone: within normal limits Gait & Station: normal Patient leans: N/A   Psychiatric Specialty Exam:  Presentation  General Appearance: Appropriate for Environment  Eye Contact:Good  Speech:Clear and Coherent; Normal Rate  Speech Volume:Normal  Handedness:Right   Mood and Affect  Mood:Euthymic  Affect:Appropriate; Congruent   Thought Process  Thought Processes:Coherent; Goal Directed  Descriptions of Associations:Intact  Orientation:Full (Time, Place  and Person)  Thought Content:Logical; WDL  History of Schizophrenia/Schizoaffective disorder:Yes  Duration of Psychotic Symptoms:Greater than six months  Hallucinations:Hallucinations: None  Ideas of Reference:None  Suicidal Thoughts:Suicidal Thoughts: No  Homicidal Thoughts:Homicidal Thoughts: No   Sensorium  Memory:Immediate Good; Recent Good; Remote Good  Judgment:Intact  Insight:Present   Executive Functions  Concentration:Fair  Attention Span:Good  Recall:Good  Fund of Knowledge:Good  Language:Good   Psychomotor Activity  Psychomotor Activity:Psychomotor Activity: Normal   Assets  Assets:Communication Skills; Desire for Improvement; Housing; Leisure Time; Resilience; Social Support   Sleep  Sleep:Sleep: Good (Patient reporting he sleeps good in his own bed) Number of Hours of Sleep: 6    Physical Exam: Physical Exam Vitals and nursing note reviewed. Exam conducted with a chaperone present.  Constitutional:      General: He is not in acute distress.    Appearance: Normal appearance. He is not ill-appearing.  Cardiovascular:     Rate and Rhythm: Normal rate.     Comments: Elevated blood pressure Pulmonary:     Effort: Pulmonary effort is normal.  Neurological:     Mental Status: He is alert and oriented to person, place, and time.  Psychiatric:         Attention and Perception: Attention and perception normal. He does not perceive auditory or visual hallucinations.        Mood and Affect: Mood and affect normal.        Speech: Speech normal.        Behavior: Behavior normal. Behavior is cooperative.        Thought Content: Thought content normal. Thought content is not paranoid or delusional. Thought content does not include homicidal or suicidal ideation.        Cognition and Memory: Cognition and memory normal.        Judgment: Judgment normal.   Review of Systems  Constitutional: Negative.   Eyes: Negative.   Respiratory: Negative.    Cardiovascular:        High blood pressure   Gastrointestinal: Negative.   Genitourinary: Negative.   Musculoskeletal: Negative.   Skin: Negative.   Neurological: Negative.   Endo/Heme/Allergies: Negative.   Psychiatric/Behavioral:  Depression: Stable. Hallucinations: Denies. Substance abuse: Denies. Suicidal ideas: Denies. Nervous/anxious: Stable. Insomnia: Denies; reporting he sleep good in his own bed.        Patient reporting he is fine.  States has a chronic history of delusional thinking an [facility] and next appointment is tomorrow 08/06/21 at 10:00  Blood pressure (!) 152/81, pulse 66, temperature 98.8 F (37.1 C), temperature source Oral, resp. rate 18, height 5\' 8"  (1.727 m), weight 85.3 kg, SpO2 98 %. Body mass index is 28.59 kg/m.  Treatment Plan Summary: Plan psychiatrically clear.  Patient at baseline.  Patient is to keep scheduled appointment with his [facility] affairs psychiatrist for August 06, 2021 at 10 AM  Disposition: Psychiatrically Cleared No evidence of imminent risk to self or others at present.   Patient does not meet criteria for psychiatric inpatient admission. Supportive therapy provided about ongoing stressors. Discussed crisis plan, support from social network, calling 911, coming to the Emergency Department, and calling Suicide  Hotline. Keep scheduled appointment with current psychiatric provider.    This service was provided via telemedicine using a 2-way, interactive audio and video technology.  Names of all persons participating in this telemedicine service and their role in this encounter. Name: August 08, 2021 Role: NP  Name: Dr. Assunta Found Role: Psychiatrist  Name: William Fitzpatrick Role: Patient  Name: Job Founds, RN Role: Patient's nurse sent a secure message informing:  Psychiatric consult complete; patient psychiatric cleared.  Patient to keep current appointment with current psychiatric provider on 08/06/21 at 10 am.  Please inform MD only default listed.      William Mccumbers, NP 08/05/2021 2:03 PM

## 2021-08-05 NOTE — ED Notes (Signed)
Update given to pts daughter, brandy

## 2021-08-05 NOTE — Discharge Instructions (Signed)
For your behavioral health needs you are advised to continue treatment with your [facility] provider at your earliest opportunity:       [facility]      [address] [place], [place] [address]      [phone]

## 2021-08-05 NOTE — ED Notes (Signed)
Pt changed into purple scrubs and hospital socks. Belongings placed in [facility] locker #3.

## 2021-08-05 NOTE — ED Notes (Signed)
Pt's valuables placed with security. 

## 2021-08-13 NOTE — Progress Notes (Signed)
Carelink Summary Report / Loop Recorder 

## 2021-08-20 ENCOUNTER — Ambulatory Visit (INDEPENDENT_AMBULATORY_CARE_PROVIDER_SITE_OTHER): Payer: Medicare Other

## 2021-08-20 DIAGNOSIS — I63532 Cerebral infarction due to unspecified occlusion or stenosis of left posterior cerebral artery: Secondary | ICD-10-CM

## 2021-08-20 LAB — CUP PACEART REMOTE DEVICE CHECK
Date Time Interrogation Session: 20220905062413
Implantable Pulse Generator Implant Date: 20220216

## 2021-08-26 ENCOUNTER — Ambulatory Visit: Payer: Medicare Other | Admitting: Podiatry

## 2021-08-29 NOTE — Progress Notes (Signed)
Carelink Summary Report / Loop Recorder 

## 2021-09-05 ENCOUNTER — Ambulatory Visit: Payer: Medicare Other | Admitting: Endocrinology

## 2021-09-19 ENCOUNTER — Ambulatory Visit (INDEPENDENT_AMBULATORY_CARE_PROVIDER_SITE_OTHER): Payer: Medicare Other

## 2021-09-19 DIAGNOSIS — I63532 Cerebral infarction due to unspecified occlusion or stenosis of left posterior cerebral artery: Secondary | ICD-10-CM | POA: Diagnosis not present

## 2021-09-23 LAB — CUP PACEART REMOTE DEVICE CHECK
Date Time Interrogation Session: 20221008062445
Implantable Pulse Generator Implant Date: 20220216

## 2021-09-27 NOTE — Progress Notes (Signed)
Carelink Summary Report / Loop Recorder 

## 2021-10-18 ENCOUNTER — Other Ambulatory Visit: Payer: Self-pay

## 2021-10-18 ENCOUNTER — Ambulatory Visit (INDEPENDENT_AMBULATORY_CARE_PROVIDER_SITE_OTHER): Payer: Medicare Other | Admitting: Endocrinology

## 2021-10-18 VITALS — BP 160/90 | HR 85 | Ht 68.0 in | Wt 187.0 lb

## 2021-10-18 DIAGNOSIS — E1165 Type 2 diabetes mellitus with hyperglycemia: Secondary | ICD-10-CM

## 2021-10-18 DIAGNOSIS — E11319 Type 2 diabetes mellitus with unspecified diabetic retinopathy without macular edema: Secondary | ICD-10-CM | POA: Diagnosis not present

## 2021-10-18 DIAGNOSIS — Z794 Long term (current) use of insulin: Secondary | ICD-10-CM | POA: Diagnosis not present

## 2021-10-18 LAB — POCT GLYCOSYLATED HEMOGLOBIN (HGB A1C): Hemoglobin A1C: 13.6 % — AB (ref 4.0–5.6)

## 2021-10-18 MED ORDER — DEXCOM G6 TRANSMITTER MISC: 1.0000 | Freq: Once | 1 refills | Status: AC

## 2021-10-18 MED ORDER — TRULICITY 1.5 MG/0.5ML ~~LOC~~ SOAJ: 1.5000 mg | SUBCUTANEOUS | 3 refills | Status: AC

## 2021-10-18 MED ORDER — TRULICITY 1.5 MG/0.5ML ~~LOC~~ SOAJ: 1.5000 mg | SUBCUTANEOUS | 3 refills | Status: DC

## 2021-10-18 MED ORDER — DEXCOM G6 SENSOR MISC: 1.0000 | 3 refills | Status: AC

## 2021-10-18 MED ORDER — NOVOLOG MIX 70/30 FLEXPEN (70-30) 100 UNIT/ML ~~LOC~~ SUPN: 20.0000 [IU] | PEN_INJECTOR | Freq: Two times a day (BID) | SUBCUTANEOUS | 3 refills | Status: DC

## 2021-10-18 MED ORDER — NOVOLOG MIX 70/30 FLEXPEN (70-30) 100 UNIT/ML ~~LOC~~ SUPN: 20.0000 [IU] | PEN_INJECTOR | Freq: Two times a day (BID) | SUBCUTANEOUS | 3 refills | Status: AC

## 2021-10-18 MED ORDER — DEXCOM G6 SENSOR MISC: 1.0000 | 3 refills | Status: DC

## 2021-10-18 NOTE — Patient Instructions (Addendum)
check your blood sugar 4 times a day: before the 3 meals, and at bedtime.  also check if you have symptoms of your blood sugar being too high or too low.  please keep a record of the readings and bring it to your next appointment here (or you can bring the meter itself).  You can write it on any piece of paper.  please call us sooner if your blood sugar goes below 70, or if you have a lot of readings over 200. I have sent a prescription to your pharmacy, to add increase the Trulicity Please continue the same insulin and metformin.   Please come back for a follow-up appointment in January.

## 2021-10-18 NOTE — Progress Notes (Signed)
Subjective:    Patient ID: William Fitzpatrick, male    DOB: January 21, 1956, 65 y.o.   MRN: [id number]  HPI Pt returns for f/u of diabetes mellitus:  DM type: Insulin-requiring type 2 DM Dx'ed: 1994 Complications: DR Therapy: insulin since 2010 (pump rx since 2021).  DKA: never Severe hypoglycemia: never Pancreatitis: never Pancreatic imaging: never SDOH: he did not get Rybelsus, due to cost. Other: he also took pump rx in 2020; he takes Tandem pump Interval history: He stopped using pump a few mos ago, but he wants to resume continuous glucose monitoring.  He now takes 70/30, 20 units BID.  He says cbg varies from 174-504.  He wants to continue BID premixed insulin.   Past Medical History:  Diagnosis Date   Diabetes mellitus without complication (HCC)    Hypertension    Myocardial infarction (HCC)    Schizophrenia (HCC)    Stroke [facility])     Past Surgical History:  Procedure Laterality Date   LOOP RECORDER INSERTION N/A 01/30/2021   Procedure: LOOP RECORDER INSERTION;  Surgeon: Marinus Maw, MD;  Location: [facility] INVASIVE CV LAB;  Service: Cardiovascular;  Laterality: N/A;    Social History   Socioeconomic History   Marital status: Divorced    Spouse name: Not on file   Number of children: Not on file   Years of education: Not on file   Highest education level: Not on file  Occupational History   Not on file  Tobacco Use   Smoking status: Every Day    Types: Cigarettes   Smokeless tobacco: Never  Vaping Use   Vaping Use: Never used  Substance and Sexual Activity   Alcohol use: Yes   Drug use: Not Currently   Sexual activity: Not on file  Other Topics Concern   Not on file  Social History Narrative   Not on file   Social Determinants of Health   Financial Resource Strain: Not on file  Food Insecurity: No Food Insecurity   Worried About Running Out of Food in the Last Year: Never true   Ran Out of Food in the Last Year: Never true  Transportation Needs: No  Transportation Needs   Lack of Transportation (Medical): No   Lack of Transportation (Non-Medical): No  Physical Activity: Not on file  Stress: Not on file  Social Connections: Not on file  Intimate Partner Violence: Not on file    Current Outpatient Medications on File Prior to Visit  Medication Sig Dispense Refill   Apoaequorin (PREVAGEN PO) Take 1 capsule by mouth daily.     ARIPiprazole ER (ABILIFY MAINTENA) 400 MG SRER injection Inject 400 mg into the muscle every 30 (thirty) days.     aspirin 81 MG EC tablet TAKE 1 TABLET (81 MG TOTAL) BY MOUTH DAILY. SWALLOW WHOLE. 21 tablet 11   clopidogrel (PLAVIX) 75 MG tablet Take 1 tablet (75 mg total) by mouth daily. 30 tablet 1   divalproex (DEPAKOTE ER) 500 MG 24 hr tablet Take 500 mg by mouth daily.     hydrochlorothiazide (MICROZIDE) 12.5 MG capsule Take 1 capsule (12.5 mg total) by mouth daily. Resume on 2/18     INVEGA SUSTENNA 234 MG/1.5ML SUSY injection Inject 234 mg into the muscle every 30 (thirty) days.     levothyroxine (SYNTHROID) 75 MCG tablet Take 75 mcg by mouth daily before breakfast.     metFORMIN (GLUCOPHAGE-XR) 500 MG 24 hr tablet Take 1,000 mg by mouth 2 (two) times daily.  nicotine (NICODERM CQ - DOSED IN MG/24 HOURS) 21 mg/24hr patch Place 1 patch (21 mg total) onto the skin daily. 28 patch 0   nicotine (NICODERM CQ - DOSED IN MG/24 HOURS) 21 mg/24hr patch PLACE 1 PATCH (21 MG TOTAL) ONTO THE SKIN DAILY. 28 patch 0   OLANZapine (ZYPREXA) 20 MG tablet Take 10 mg by mouth at bedtime.     rosuvastatin (CRESTOR) 20 MG tablet Take 1 tablet (20 mg total) by mouth daily. 30 tablet 0   No current facility-administered medications on file prior to visit.    No Known Allergies  Family History  Problem Relation Age of Onset   Diabetes Neg Hx    Stroke Neg Hx     BP (!) 160/90 (BP Location: Right Arm, Patient Position: Sitting, Cuff Size: Normal)   Pulse 85   Ht 5\' 8"  (1.727 m)   Wt 187 lb (84.8 kg)   SpO2 98%   BMI  28.43 kg/m    Review of Systems He denies hypoglycemia    Objective:   Physical Exam    Lab Results  Component Value Date   HGBA1C 13.6 (A) 10/18/2021      Assessment & Plan:  Insulin-requiring type 2 DM: severe exacerbation.   Patient Instructions  check your blood sugar 4 times a day: before the 3 meals, and at bedtime.  also check if you have symptoms of your blood sugar being too high or too low.  please keep a record of the readings and bring it to your next appointment here (or you can bring the meter itself).  You can write it on any piece of paper.  please call 13/03/2021 sooner if your blood sugar goes below 70, or if you have a lot of readings over 200. I have sent a prescription to your pharmacy, to add increase the Trulicity Please continue the same insulin and metformin.   Please come back for a follow-up appointment in January.

## 2021-10-21 ENCOUNTER — Ambulatory Visit (INDEPENDENT_AMBULATORY_CARE_PROVIDER_SITE_OTHER): Payer: Medicare Other

## 2021-10-21 DIAGNOSIS — I63532 Cerebral infarction due to unspecified occlusion or stenosis of left posterior cerebral artery: Secondary | ICD-10-CM

## 2021-10-24 LAB — CUP PACEART REMOTE DEVICE CHECK
Date Time Interrogation Session: 20221110012255
Implantable Pulse Generator Implant Date: 20220216

## 2021-10-27 ENCOUNTER — Other Ambulatory Visit: Payer: Self-pay

## 2021-10-27 ENCOUNTER — Emergency Department (HOSPITAL_COMMUNITY)
Admission: EM | Admit: 2021-10-27 | Discharge: 2021-10-28 | Disposition: A | Discharge status: Home / Self Care | Payer: Medicare Other | Attending: Emergency Medicine | Admitting: Emergency Medicine

## 2021-10-27 ENCOUNTER — Encounter (HOSPITAL_COMMUNITY): Payer: Self-pay | Admitting: Emergency Medicine

## 2021-10-27 DIAGNOSIS — E871 Hypo-osmolality and hyponatremia: Secondary | ICD-10-CM | POA: Diagnosis not present

## 2021-10-27 DIAGNOSIS — E860 Dehydration: Secondary | ICD-10-CM | POA: Diagnosis not present

## 2021-10-27 DIAGNOSIS — Z794 Long term (current) use of insulin: Secondary | ICD-10-CM | POA: Diagnosis not present

## 2021-10-27 DIAGNOSIS — E113299 Type 2 diabetes mellitus with mild nonproliferative diabetic retinopathy without macular edema, unspecified eye: Secondary | ICD-10-CM | POA: Diagnosis not present

## 2021-10-27 DIAGNOSIS — I10 Essential (primary) hypertension: Secondary | ICD-10-CM | POA: Diagnosis not present

## 2021-10-27 DIAGNOSIS — E669 Obesity, unspecified: Secondary | ICD-10-CM | POA: Diagnosis not present

## 2021-10-27 DIAGNOSIS — G20 Parkinson's disease: Secondary | ICD-10-CM | POA: Diagnosis not present

## 2021-10-27 DIAGNOSIS — Z79899 Other long term (current) drug therapy: Secondary | ICD-10-CM | POA: Diagnosis not present

## 2021-10-27 DIAGNOSIS — Z7984 Long term (current) use of oral hypoglycemic drugs: Secondary | ICD-10-CM | POA: Diagnosis not present

## 2021-10-27 DIAGNOSIS — F17210 Nicotine dependence, cigarettes, uncomplicated: Secondary | ICD-10-CM | POA: Diagnosis not present

## 2021-10-27 DIAGNOSIS — E039 Hypothyroidism, unspecified: Secondary | ICD-10-CM | POA: Insufficient documentation

## 2021-10-27 DIAGNOSIS — R252 Cramp and spasm: Secondary | ICD-10-CM | POA: Diagnosis present

## 2021-10-27 LAB — CK: Total CK: 78 U/L (ref 49–397)

## 2021-10-27 LAB — CBC WITH DIFFERENTIAL/PLATELET
Abs Immature Granulocytes: 0.09 10*3/uL — ABNORMAL HIGH (ref 0.00–0.07)
Basophils Absolute: 0.1 10*3/uL (ref 0.0–0.1)
Basophils Relative: 1 %
Eosinophils Absolute: 0.1 10*3/uL (ref 0.0–0.5)
Eosinophils Relative: 1 %
HCT: 48.2 % (ref 39.0–52.0)
Hemoglobin: 17.5 g/dL — ABNORMAL HIGH (ref 13.0–17.0)
Immature Granulocytes: 1 %
Lymphocytes Relative: 19 %
Lymphs Abs: 1.8 10*3/uL (ref 0.7–4.0)
MCH: 31.1 pg (ref 26.0–34.0)
MCHC: 36.3 g/dL — ABNORMAL HIGH (ref 30.0–36.0)
MCV: 85.8 fL (ref 80.0–100.0)
Monocytes Absolute: 0.6 10*3/uL (ref 0.1–1.0)
Monocytes Relative: 7 %
Neutro Abs: 6.9 10*3/uL (ref 1.7–7.7)
Neutrophils Relative %: 71 %
Platelets: 272 10*3/uL (ref 150–400)
RBC: 5.62 MIL/uL (ref 4.22–5.81)
RDW: 12.3 % (ref 11.5–15.5)
WBC: 9.5 10*3/uL (ref 4.0–10.5)
nRBC: 0 % (ref 0.0–0.2)

## 2021-10-27 LAB — BASIC METABOLIC PANEL
Anion gap: 11 (ref 5–15)
BUN: 8 mg/dL (ref 8–23)
CO2: 20 mmol/L — ABNORMAL LOW (ref 22–32)
Calcium: 9.4 mg/dL (ref 8.9–10.3)
Chloride: 93 mmol/L — ABNORMAL LOW (ref 98–111)
Creatinine, Ser: 0.56 mg/dL — ABNORMAL LOW (ref 0.61–1.24)
GFR, Estimated: >60 mL/min (ref >60)
Glucose, Bld: 312 mg/dL — ABNORMAL HIGH (ref 70–99)
Potassium: 3.7 mmol/L (ref 3.5–5.1)
Sodium: 124 mmol/L — ABNORMAL LOW (ref 135–145)

## 2021-10-27 NOTE — ED Provider Notes (Signed)
Emergency Medicine Provider Triage Evaluation Note  Mitesh Rosendahl , a 65 y.o. male  was evaluated in triage.  Pt complains of legs cramp.  Review of Systems  Positive: Legs cramp Negative: Fever, cp, sob, hemoptysis, trauma, swelling  Physical Exam  BP (!) 173/107 (BP Location: Left Arm)   Pulse 100   Temp 98.9 F (37.2 C) (Oral)   Resp 18   Ht 5\' 8"  (1.727 m)   Wt 82.1 kg   SpO2 100%   BMI 27.52 kg/m  Gen:   Awake, no distress   Resp:  Normal effort  MSK:   Moves extremities without difficulty  Other:    Medical Decision Making  Medically screening exam initiated at 5:04 PM.  Appropriate orders placed.  Lukka Black was informed that the remainder of the evaluation will be completed by another provider, this initial triage assessment does not replace that evaluation, and the importance of remaining in the ED until their evaluation is complete.  Pt report he is on a 3 days fast.  For the past 2 days he notice bilateral leg cramping worse with ambulation.  No cp, sob, dark urine, or recent injury.  No hx of DVT.     [name], PA-C 10/27/21 1708    [place], 10/29/21, MD 10/27/21 [phone]

## 2021-10-27 NOTE — ED Triage Notes (Signed)
PT reports completing three day religious fast today. States he has been drinking a lot of water. C/o bilateral leg pain in calf and thigh after walking approximately 268ft. Denies chest pain and SOB.

## 2021-10-28 DIAGNOSIS — E860 Dehydration: Secondary | ICD-10-CM | POA: Diagnosis not present

## 2021-10-28 MED ORDER — LACTATED RINGERS IV BOLUS
1000.0000 mL | Freq: Once | INTRAVENOUS | Status: AC
  Administered 2021-10-28: 1000 mL via INTRAVENOUS

## 2021-10-28 NOTE — ED Provider Notes (Signed)
[facility]-EMERGENCY DEPT Provider Note   CSN: [id number] Arrival date & time: 10/27/21  1634     History Chief Complaint  Patient presents with   Leg Pain    William Fitzpatrick is a 65 y.o. male who presents with cramping of his calves and his left thigh with ambulation of long distances after completing a 3-day fast for religious reasons.  States he was drinking only water for the last 3 days and not consuming any food.  Denies any other symptoms including chest pain, shortness of breath, palpitations, syncope, generalized weakness, fevers, or chills.  Denies any recent injuries.  Denies any numbness, tingling or weakness in his legs or any back pain.   I have personally reviewed this patient's medical records. He has history of schizophrenia, CVA, MI, Dmt2, Hypertension. He states he is no longer taking aspirin or plavix. NO longer taking rosuvastatin, or levothyroxine at this time as he took himself off. He has not had his trulicity or metformin x 2 months; awaiting refill from endocrinologist.  HPI     Past Medical History:  Diagnosis Date   Diabetes mellitus without complication (HCC)    Hypertension    Myocardial infarction (HCC)    Schizophrenia (HCC)    Stroke [facility])     Patient Active Problem List   Diagnosis Date Noted   Delusion (HCC)    Blood clotting disorder (HCC) 06/04/2021   Type 2 diabetes mellitus with hyperglycemia (HCC) 02/11/2021   Vitamin D deficiency 02/11/2021   Impacted cerumen, bilateral 02/11/2021   Complete loss of teeth due to caries, class I 02/11/2021   Abnormal involuntary movements 02/11/2021   Alcohol abuse 02/11/2021   Constipation 02/11/2021   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, not elsewhere classified 02/11/2021   Diabetes mellitus type 2 without retinopathy (HCC) 02/11/2021   Disorder of bursae and tendons in shoulder region 02/11/2021   Disorder of refraction and accommodation 02/11/2021   Excessive cerumen  in ear canal 02/11/2021   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 02/11/2021   Hip pain 02/11/2021   Inguinal hernia 02/11/2021   Major depressive disorder, single episode 02/11/2021   Mild nonproliferative diabetic retinopathy (HCC) 02/11/2021   Mixed hyperlipidemia 02/11/2021   Obesity 02/11/2021   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   Pure hypercholesterolemia 02/11/2021   Special screening for malignant neoplasms, colon 02/11/2021   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   Seborrheic dermatitis, unspecified 02/11/2021   Restless legs syndrome 02/11/2021   Need for assistance with personal care 02/11/2021   Hyperlipidemia 02/11/2021   Persons encountering health services in other specified circumstances 02/11/2021   Counseling, unspecified 02/11/2021   Fractured dental restorative material, unspecified 02/11/2021   Nail dystrophy 02/11/2021   Other specified urinary incontinence 02/11/2021   Other abnormalities of gait and mobility 02/11/2021   Other general symptoms and signs 02/11/2021   Encounter for general adult medical examination w/o abnormal findings 02/11/2021   Encounter for dental exam and cleaning w/o abnormal findings 02/11/2021   Encounter for examination and observation for other specified reasons 02/11/2021   Encounter for immunization 02/11/2021   Fitting and adjustment of spectacles and contact lenses 02/11/2021   Encounter for other specified aftercare 02/11/2021   Spiritual or religious counseling 02/11/2021   Other specified counseling 02/11/2021   Nicotine dependence, other tobacco product, uncomplicated 01/29/2021   Hypertensive crisis, unspecified 01/29/2021   Hypothyroidism, unspecified 01/29/2021   Acute cerebrovascular accident (CVA) due to occlusion of left posterior cerebral artery (HCC)  01/28/2021   Schizophrenia, paranoid (HCC) 10/01/2019   Suicidal thoughts     Past Surgical History:  Procedure Laterality Date   LOOP  RECORDER INSERTION N/A 01/30/2021   Procedure: LOOP RECORDER INSERTION;  Surgeon: Marinus Maw, MD;  Location: [facility] INVASIVE CV LAB;  Service: Cardiovascular;  Laterality: N/A;       Family History  Problem Relation Age of Onset   Diabetes Neg Hx    Stroke Neg Hx     Social History   Tobacco Use   Smoking status: Every Day    Types: Cigarettes   Smokeless tobacco: Never  Vaping Use   Vaping Use: Never used  Substance Use Topics   Alcohol use: Yes   Drug use: Not Currently    Home Medications Prior to Admission medications   Medication Sig Start Date End Date Taking? Authorizing Provider  amLODipine (NORVASC) 10 MG tablet Take 10 mg by mouth daily. 07/02/21  Yes [provider]  divalproex (DEPAKOTE ER) 250 MG 24 hr tablet Take 750 mg by mouth at bedtime. 03/15/21  Yes [provider]  Dulaglutide (TRULICITY) 1.5 MG/0.5ML SOPN Inject 1.5 mg into the skin once a week. 10/18/21  Yes Romero Belling, MD  hydrochlorothiazide (MICROZIDE) 12.5 MG capsule Take 1 capsule (12.5 mg total) by mouth daily. Resume on 2/18 01/30/21  Yes Calvert Cantor, MD  insulin aspart protamine - aspart (NOVOLOG MIX 70/30 FLEXPEN) (70-30) 100 UNIT/ML FlexPen Inject 20 Units into the skin 2 (two) times daily with a meal. And pen needles 2/day 10/18/21  Yes Romero Belling, MD  losartan (COZAAR) 100 MG tablet Take 100 mg by mouth daily. 04/24/21  Yes [provider]  OLANZapine (ZYPREXA) 7.5 MG tablet Take 7.5 mg by mouth at bedtime.   Yes [provider]  ARIPiprazole ER (ABILIFY MAINTENA) 400 MG SRER injection Inject 400 mg into the muscle every 30 (thirty) days. 05/02/21 05/03/22  [provider]  aspirin 81 MG EC tablet TAKE 1 TABLET (81 MG TOTAL) BY MOUTH DAILY. SWALLOW WHOLE. Patient not taking: Reported on 10/28/2021 01/30/21 01/30/22  Calvert Cantor, MD  clopidogrel (PLAVIX) 75 MG tablet Take 1 tablet (75 mg total) by mouth daily. Patient not taking: Reported on 10/28/2021  01/31/21   Calvert Cantor, MD  Continuous Blood Gluc Sensor (DEXCOM G6 SENSOR) MISC 1 Device by Does not apply route See admin instructions. 10/18/21   Romero Belling, MD  levothyroxine (SYNTHROID) 75 MCG tablet Take 75 mcg by mouth daily before breakfast. Patient not taking: Reported on 10/28/2021    [provider]  metFORMIN (GLUCOPHAGE-XR) 500 MG 24 hr tablet Take 1,000 mg by mouth 2 (two) times daily. Patient not taking: Reported on 10/28/2021 08/14/20   [provider]  nicotine (NICODERM CQ - DOSED IN MG/24 HOURS) 21 mg/24hr patch Place 1 patch (21 mg total) onto the skin daily. Patient not taking: No sig reported 01/31/21   Calvert Cantor, MD  rosuvastatin (CRESTOR) 20 MG tablet Take 1 tablet (20 mg total) by mouth daily. Patient not taking: Reported on 10/28/2021 01/31/21   Calvert Cantor, MD    Allergies    Patient has no known allergies.  Review of Systems   Review of Systems  Constitutional: Negative.   HENT: Negative.    Eyes: Negative.   Respiratory: Negative.    Cardiovascular: Negative.   Gastrointestinal: Negative.   Genitourinary: Negative.   Musculoskeletal:  Positive for myalgias.  Skin: Negative.   Neurological: Negative.    Physical Exam  Updated Vital Signs BP (!) 198/88   Pulse 71   Temp 98.9 F (37.2 C) (Oral)   Resp 16   Ht 5\' 8"  (1.727 m)   Wt 82.1 kg   SpO2 100%   BMI 27.52 kg/m   Physical Exam Vitals and nursing note reviewed.  Constitutional:      Appearance: He is obese. He is not ill-appearing or toxic-appearing.  HENT:     Head: Normocephalic and atraumatic.     Nose: Nose normal. No congestion.     Mouth/Throat:     Mouth: Mucous membranes are moist.     Pharynx: No oropharyngeal exudate or posterior oropharyngeal erythema.  Eyes:     General:        Right eye: No discharge.        Left eye: No discharge.     Extraocular Movements: Extraocular movements intact.     Conjunctiva/sclera: Conjunctivae normal.     Pupils:  Pupils are equal, round, and reactive to light.  Cardiovascular:     Rate and Rhythm: Normal rate and regular rhythm.     Pulses: Normal pulses.     Heart sounds: Normal heart sounds. No murmur heard. Pulmonary:     Effort: Pulmonary effort is normal. No respiratory distress.     Breath sounds: Normal breath sounds. No wheezing or rales.  Abdominal:     General: Bowel sounds are normal. There is no distension.     Palpations: Abdomen is soft.     Tenderness: There is no abdominal tenderness. There is no guarding or rebound.  Musculoskeletal:        General: No deformity.     Cervical back: Neck supple. No tenderness.     Right lower leg: No edema.     Left lower leg: No edema.     Comments: No calf TTP, skin changes, swelling, or deformities. Compartments are soft.   Lymphadenopathy:     Cervical: No cervical adenopathy.  Skin:    General: Skin is warm and dry.     Capillary Refill: Capillary refill takes less than 2 seconds.  Neurological:     General: No focal deficit present.     Mental Status: He is alert and oriented to person, place, and time. Mental status is at baseline.  Psychiatric:        Mood and Affect: Mood normal.    ED Results / Procedures / Treatments   Labs (all labs ordered are listed, but only abnormal results are displayed) Labs Reviewed  CBC WITH DIFFERENTIAL/PLATELET - Abnormal; Notable for the following components:      Result Value   Hemoglobin 17.5 (*)    MCHC 36.3 (*)    Abs Immature Granulocytes 0.09 (*)    All other components within normal limits  BASIC METABOLIC PANEL - Abnormal; Notable for the following components:   Sodium 124 (*)    Chloride 93 (*)    CO2 20 (*)    Glucose, Bld 312 (*)    Creatinine, Ser 0.56 (*)    All other components within normal limits  CK    EKG None  Radiology No results found.  Procedures Procedures   Medications Ordered in ED Medications  lactated ringers bolus 1,000 mL (0 mLs Intravenous Stopped  10/28/21 0315)    ED Course  I have reviewed the triage vital signs and the nursing notes.  Pertinent labs & imaging results that were available during my care of the patient were reviewed by  me and considered in my medical decision making (see chart for details).    MDM Rules/Calculators/A&P                         65 year old male presents with leg cramps with long distance ambulation after completing 3-day fast.  Hypertensive on intake, vital signs otherwise normal. Cardiopulmonary exam is normal, abdominal exam is benign. MSK exam with soft compartments of the legs, no skin changes, deformities, erythema, or bruising.   CBC unremarkable, BMP with mild hyponatremia 124, hypochloremia 93.  CK is normal, 78.  Suspect patient symptoms may be secondary to acute dehydration and electrolyte derangement.  Will provide fluid bolus and reevaluate.  Physical exam not consistent with compartment syndrome.  HPI and labs and consistent with rhabdomyolysis.  Patient does have poorly controlled diabetes, however is not describing symptoms consistent with diabetic neuropathy.  Suspect patient is truly experiencing muscle spasms and recommend increased hydration at home incorporating electrolyte supplementation given recent poor nutrition.  Patient reevaluated after fluid bolus with improvement in his leg pain.  Ambulatory in the ED without difficulty.  No further work-up warranted in the ER at this time.  Dan voiced understanding of his medical evaluation and treatment plan.  Each of his questions was answered to his expressed satisfaction.  Return precautions were given.  Patient has well-appearing, stable, and appropriate for discharge at this time.  This chart was dictated using voice recognition software, Dragon. Despite the best efforts of this provider to proofread and correct errors, errors may still occur which can change documentation meaning.   Final Clinical Impression(s) / ED Diagnoses Final  diagnoses:  Dehydration    Rx / DC Orders ED Discharge Orders     None        Sherrilee Gilles 10/28/21 0420    Molpus, Jonny Ruiz, MD 10/28/21 0501

## 2021-10-28 NOTE — Discharge Instructions (Signed)
You are seen here today for your leg cramps.  Suspect it was secondary to dehydration and low electrolytes.  You were given IV fluids in the ER.  Please continue to drink plenty of water and electrolyte drinks at home for the next couple of days and follow-up with your primary care doctor.  Return to the ER with any new severe symptoms.

## 2021-10-29 NOTE — Progress Notes (Signed)
Carelink Summary Report / Loop Recorder 

## 2021-11-21 ENCOUNTER — Ambulatory Visit (INDEPENDENT_AMBULATORY_CARE_PROVIDER_SITE_OTHER): Payer: Medicare Other

## 2021-11-21 DIAGNOSIS — I63532 Cerebral infarction due to unspecified occlusion or stenosis of left posterior cerebral artery: Secondary | ICD-10-CM | POA: Diagnosis not present

## 2021-11-26 ENCOUNTER — Ambulatory Visit (INDEPENDENT_AMBULATORY_CARE_PROVIDER_SITE_OTHER): Payer: Medicare Other

## 2021-11-26 DIAGNOSIS — I63532 Cerebral infarction due to unspecified occlusion or stenosis of left posterior cerebral artery: Secondary | ICD-10-CM

## 2021-11-26 LAB — CUP PACEART REMOTE DEVICE CHECK
Date Time Interrogation Session: 20221213062313
Implantable Pulse Generator Implant Date: 20220216

## 2021-11-29 NOTE — Progress Notes (Signed)
Carelink Summary Report / Loop Recorder 

## 2021-12-06 NOTE — Progress Notes (Signed)
Carelink Summary Report / Loop Recorder 

## 2021-12-11 NOTE — Addendum Note (Signed)
Addended by: Geralyn Flash D on: 12/11/2021 04:14 PM   Modules accepted: Level of Service

## 2021-12-18 ENCOUNTER — Ambulatory Visit: Payer: TRICARE For Life (TFL) | Admitting: Endocrinology

## 2021-12-30 ENCOUNTER — Ambulatory Visit (INDEPENDENT_AMBULATORY_CARE_PROVIDER_SITE_OTHER): Payer: Medicare Other

## 2021-12-30 DIAGNOSIS — I63532 Cerebral infarction due to unspecified occlusion or stenosis of left posterior cerebral artery: Secondary | ICD-10-CM

## 2021-12-30 LAB — CUP PACEART REMOTE DEVICE CHECK
Date Time Interrogation Session: 20230115230228
Implantable Pulse Generator Implant Date: 20220216

## 2021-12-30 IMAGING — MR MR HEAD W/O CM
9 of 10 series · 38 of 48 positions shown · non-contrast
Comparison: CT head from January 28, 2021 and same day CTA.

CLINICAL DATA: Neuro deficit, acute stroke suspected.

EXAM:
MRI HEAD WITHOUT CONTRAST
TECHNIQUE: Multiplanar, multiecho pulse sequences of the brain and surrounding
structures were obtained without intravenous contrast.

[Series 3: DWI · axial · 3.0mm · 1.09mm/px · z∈[-72,+85]mm · 11 of 108 slices shown (1 of 4)]
[im 1/108]
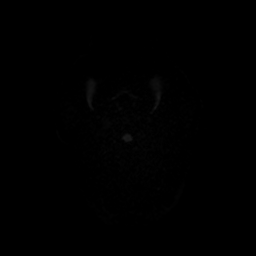
[im 11/108]
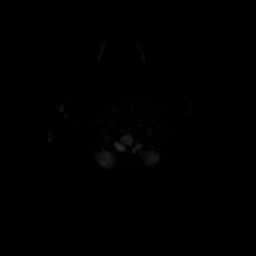
[im 22/108]
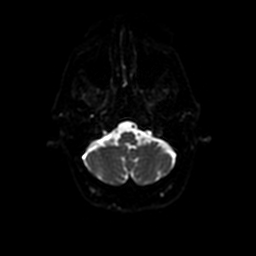
[im 33/108]
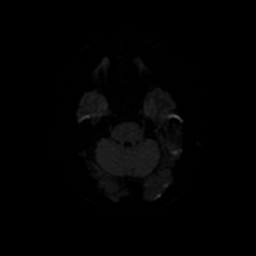
[im 43/108]
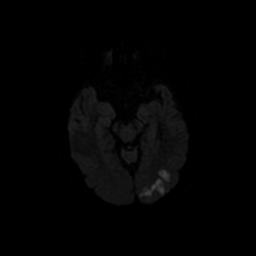
[im 54/108]
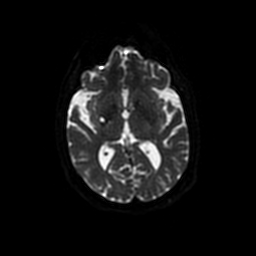
[im 65/108]
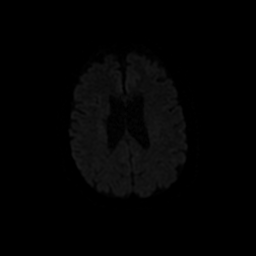
[im 75/108]
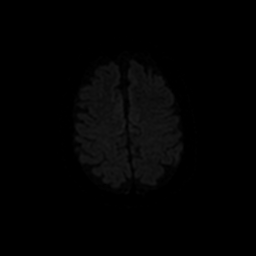
[im 86/108]
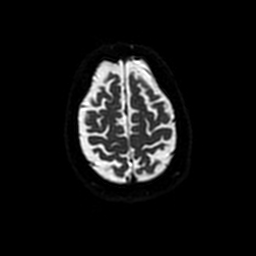
[im 97/108]
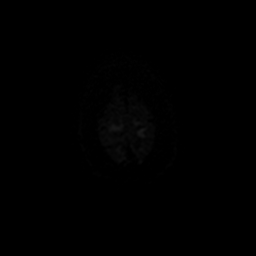
[im 108/108]
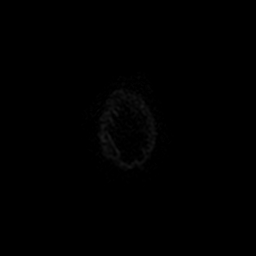

[Series 4: DWI · coronal · 5.0mm · 1.09mm/px · 8 of 80 slices shown (2 of 4)]
[im 1/80]
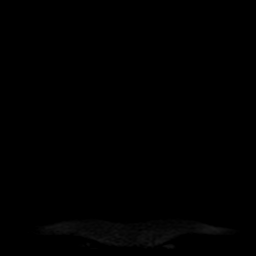
[im 12/80]
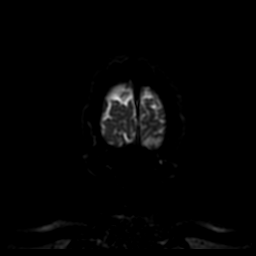
[im 23/80]
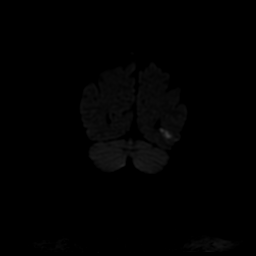
[im 34/80]
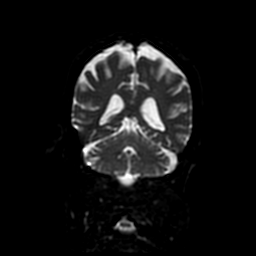
[im 46/80]
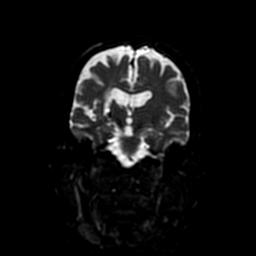
[im 57/80]
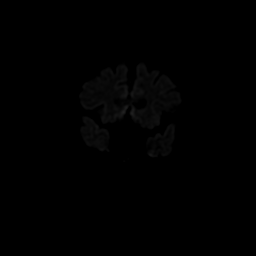
[im 68/80]
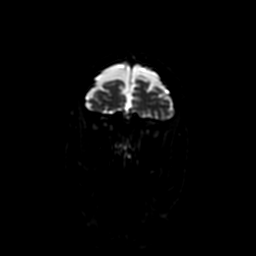
[im 80/80]
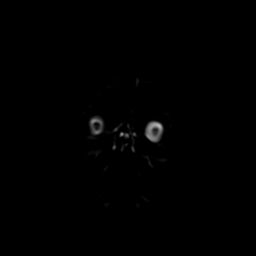

[Series 5: T1 · sagittal · 5.0mm · 0.47mm/px · 2 of 23 slices shown (1 of 2)]
[im 1/23]
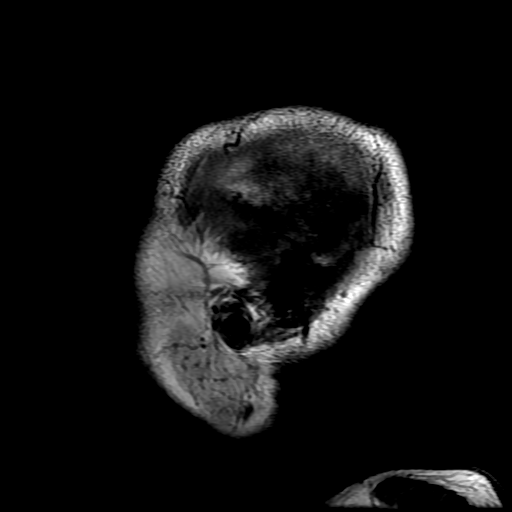
[im 23/23]
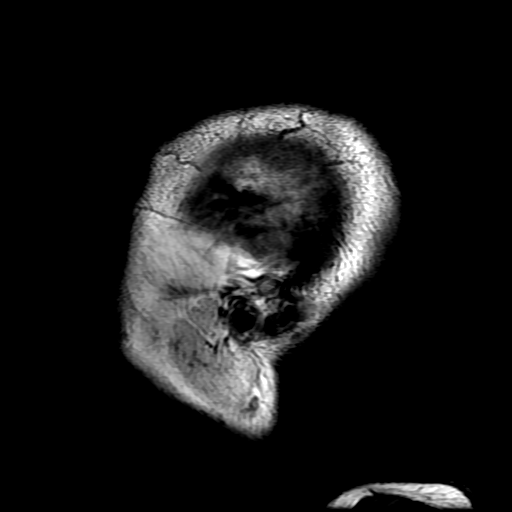

[Series 6: T2 · axial · 5.0mm · 0.43mm/px · z∈[-87,+63]mm · 2 of 26 slices shown (1 of 2)]
[im 1/26]
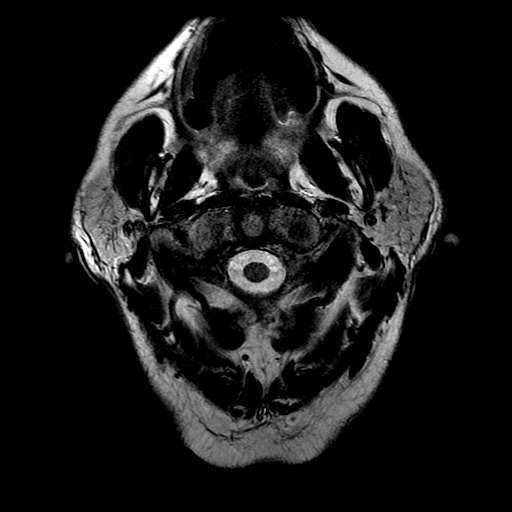
[im 26/26]
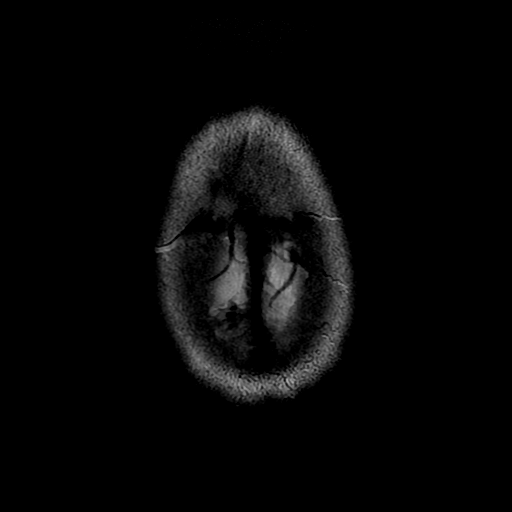

[Series 7: FLAIR · axial · 3.0mm · 0.43mm/px · z∈[-87,+63]mm · 2 of 26 slices shown]
[im 1/26]
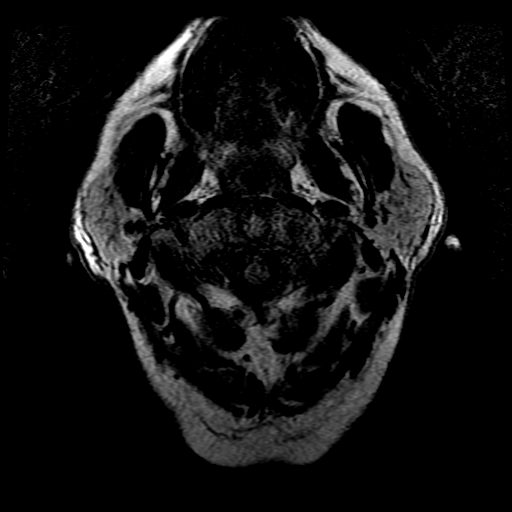
[im 26/26]
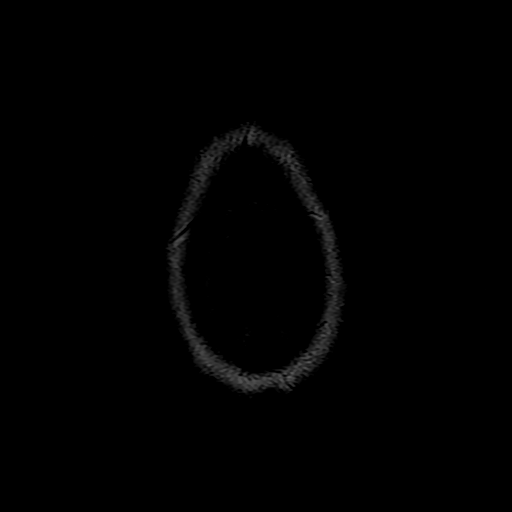

[Series 9: T1 · axial · 3.0mm · 0.47mm/px · z∈[-87,-71]mm · 2 of 104 slices shown (2 of 2)]
[im 1/104]
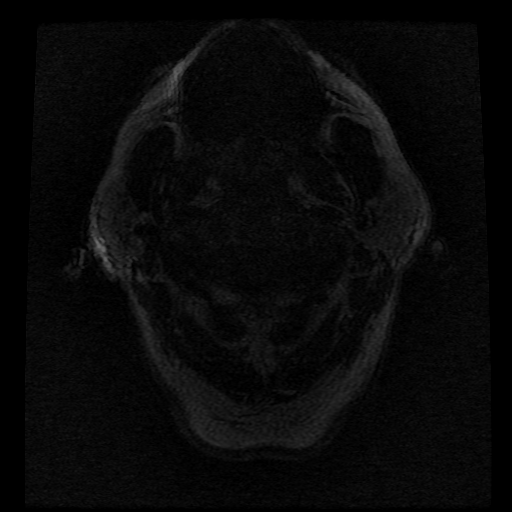
[im 12/104]
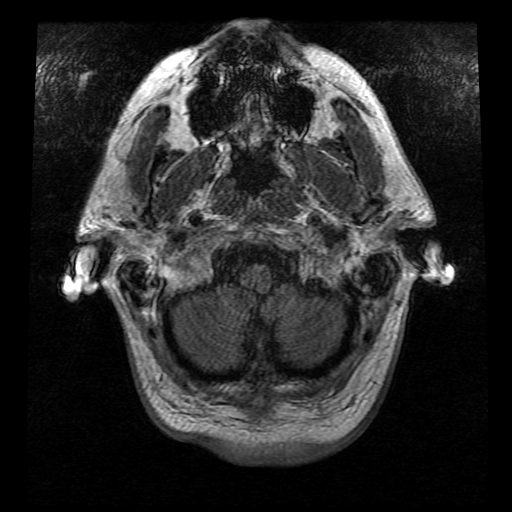

[Series 10: T2 · coronal · 5.0mm · 0.39mm/px · 2 of 25 slices shown (2 of 2)]
[im 1/25]
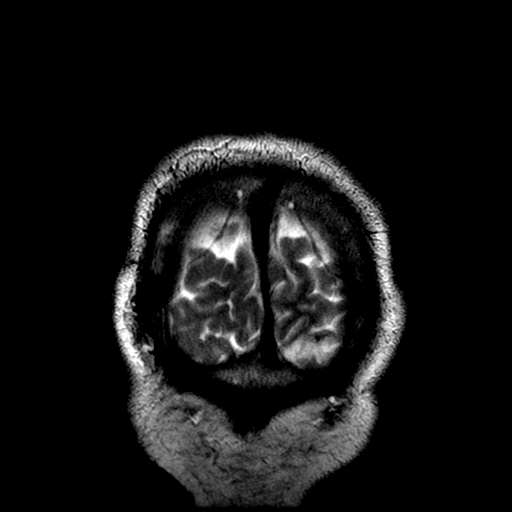
[im 25/25]
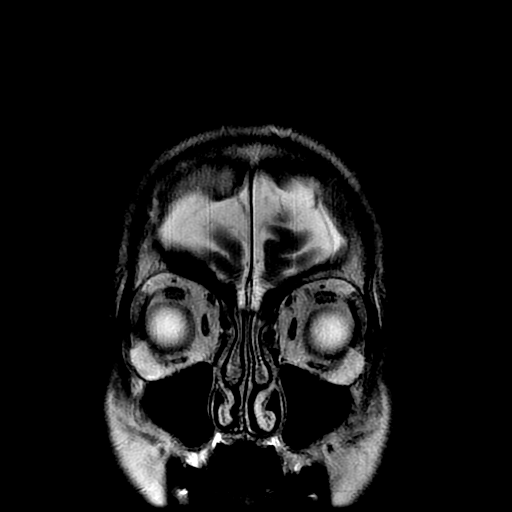

[Series 300: DWI · axial · 3.0mm · 1.09mm/px · z∈[-72,+85]mm · 5 of 54 slices shown (3 of 4)]
[im 1/54]
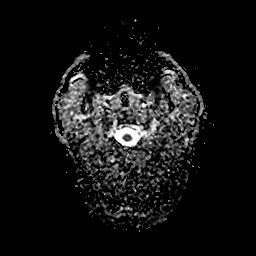
[im 14/54]
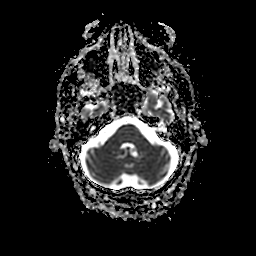
[im 27/54]
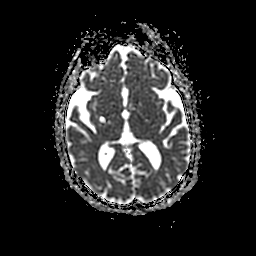
[im 40/54]
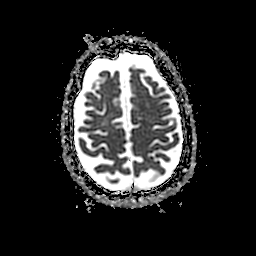
[im 54/54]
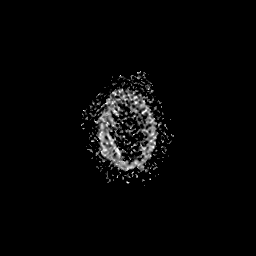

[Series 400: DWI · coronal · 5.0mm · 1.09mm/px · 4 of 38 slices shown (4 of 4)]
[im 1/38]
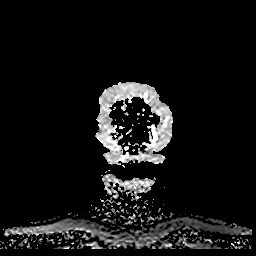
[im 13/38]
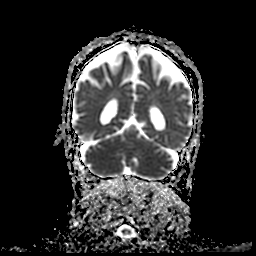
[im 25/38]
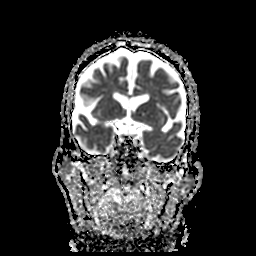
[im 38/38]
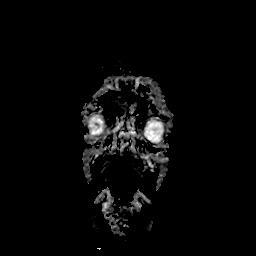

[38 of 48 positions shown; findings below may reference images not displayed]

FINDINGS: Brain: Confluent predominantly cortical infarct involving the left
occipital lobe. Additional small infarcts involving the left
posterior frontal lobe, left basal ganglia, medial left temporal
lobe (para hippocampal region) and left cerebellum. Some of these
lesions have mild associated edema, most notably the occipital
infarct. There is local occipital sulcal effacement without midline
shift. No acute hemorrhage. No hydrocephalus. Remote right basal
ganglia/corona radiata infarct. Additional mild scattered T2/FLAIR
hyperintensities, most likely related to chronic microvascular
ischemic disease. Generalized cerebral atrophy.

Vascular: Major arterial flow voids are maintained at the skull
base. Please see same dayCTA for further evaluation

Skull and upper cervical spine: Normal marrow signal.

Sinuses/Orbits: Mild paranasal sinus mucosal thickening without
air-fluid levels. Unremarkable orbits.

Other: No sizable mastoid effusion.
IMPRESSION: 1. Confluent acute or early subacute left occipital infarct with
additional small infarcts in the left posterior frontal lobe, left
basal ganglia, medial left temporal lobe and left cerebellum. Mild
occipital sulcal effacement without midline shift. Involvement of
multiple vascular territories raises suspicion for a central embolic
etiology.
2. Remote right basal ganglia/corona radiata infarct.

## 2022-01-13 NOTE — Progress Notes (Signed)
Carelink Summary Report / Loop Recorder 

## 2023-11-15 DEATH — deceased
